# Patient Record
Sex: Female | Born: 1965 | Race: Black or African American | Hispanic: No | Marital: Married | Smoking: Never smoker
Health system: Southern US, Community
[De-identification: ages and names within clinical notes are randomized; demographics above are authoritative.]

## PROBLEM LIST (undated history)

## (undated) DIAGNOSIS — M7989 Other specified soft tissue disorders: Secondary | ICD-10-CM

---

## 1997-09-03 ENCOUNTER — Emergency Department (HOSPITAL_COMMUNITY): Admission: EM | Admit: 1997-09-03 | Discharge: 1997-09-03 | Payer: Self-pay | Admitting: Internal Medicine

## 1997-09-11 ENCOUNTER — Other Ambulatory Visit: Admission: RE | Admit: 1997-09-11 | Discharge: 1997-09-11 | Payer: Self-pay | Admitting: *Deleted

## 1998-06-12 ENCOUNTER — Inpatient Hospital Stay (HOSPITAL_COMMUNITY): Admission: AD | Admit: 1998-06-12 | Discharge: 1998-06-12 | Payer: Self-pay | Admitting: Obstetrics & Gynecology

## 1998-06-18 ENCOUNTER — Inpatient Hospital Stay (HOSPITAL_COMMUNITY): Admission: AD | Admit: 1998-06-18 | Discharge: 1998-06-18 | Payer: Self-pay | Admitting: Obstetrics and Gynecology

## 1998-09-19 ENCOUNTER — Other Ambulatory Visit: Admission: RE | Admit: 1998-09-19 | Discharge: 1998-09-19 | Payer: Self-pay | Admitting: Orthopedic Surgery

## 1999-06-11 ENCOUNTER — Other Ambulatory Visit: Admission: RE | Admit: 1999-06-11 | Discharge: 1999-06-11 | Payer: Self-pay | Admitting: Obstetrics and Gynecology

## 1999-10-27 ENCOUNTER — Encounter: Admission: RE | Admit: 1999-10-27 | Discharge: 2000-01-25 | Payer: Self-pay | Admitting: Obstetrics and Gynecology

## 1999-12-15 ENCOUNTER — Inpatient Hospital Stay (HOSPITAL_COMMUNITY): Admission: AD | Admit: 1999-12-15 | Discharge: 1999-12-15 | Payer: Self-pay | Admitting: Obstetrics and Gynecology

## 1999-12-29 ENCOUNTER — Inpatient Hospital Stay (HOSPITAL_COMMUNITY): Admission: AD | Admit: 1999-12-29 | Discharge: 1999-12-29 | Payer: Self-pay | Admitting: Obstetrics and Gynecology

## 2000-01-14 ENCOUNTER — Inpatient Hospital Stay (HOSPITAL_COMMUNITY): Admission: AD | Admit: 2000-01-14 | Discharge: 2000-01-17 | Payer: Self-pay | Admitting: Obstetrics and Gynecology

## 2000-01-14 ENCOUNTER — Encounter (INDEPENDENT_AMBULATORY_CARE_PROVIDER_SITE_OTHER): Payer: Self-pay

## 2000-01-18 ENCOUNTER — Encounter: Admission: RE | Admit: 2000-01-18 | Discharge: 2000-02-25 | Payer: Self-pay | Admitting: Obstetrics and Gynecology

## 2000-02-23 ENCOUNTER — Other Ambulatory Visit: Admission: RE | Admit: 2000-02-23 | Discharge: 2000-02-23 | Payer: Self-pay | Admitting: Obstetrics and Gynecology

## 2000-05-27 ENCOUNTER — Other Ambulatory Visit: Admission: RE | Admit: 2000-05-27 | Discharge: 2000-05-27 | Payer: Self-pay | Admitting: Obstetrics and Gynecology

## 2000-09-18 ENCOUNTER — Emergency Department (HOSPITAL_COMMUNITY): Admission: EM | Admit: 2000-09-18 | Discharge: 2000-09-19 | Payer: Self-pay | Admitting: Emergency Medicine

## 2001-02-05 ENCOUNTER — Emergency Department (HOSPITAL_COMMUNITY): Admission: EM | Admit: 2001-02-05 | Discharge: 2001-02-05 | Payer: Self-pay | Admitting: Emergency Medicine

## 2001-04-13 ENCOUNTER — Other Ambulatory Visit: Admission: RE | Admit: 2001-04-13 | Discharge: 2001-04-13 | Payer: Self-pay | Admitting: Obstetrics and Gynecology

## 2001-09-26 ENCOUNTER — Other Ambulatory Visit: Admission: RE | Admit: 2001-09-26 | Discharge: 2001-09-26 | Payer: Self-pay | Admitting: Obstetrics and Gynecology

## 2001-10-20 ENCOUNTER — Inpatient Hospital Stay (HOSPITAL_COMMUNITY): Admission: AD | Admit: 2001-10-20 | Discharge: 2001-10-20 | Payer: Self-pay | Admitting: *Deleted

## 2001-10-20 ENCOUNTER — Encounter: Payer: Self-pay | Admitting: *Deleted

## 2001-11-05 ENCOUNTER — Ambulatory Visit (HOSPITAL_COMMUNITY): Admission: RE | Admit: 2001-11-05 | Discharge: 2001-11-05 | Payer: Self-pay | Admitting: Obstetrics and Gynecology

## 2001-11-05 ENCOUNTER — Encounter (INDEPENDENT_AMBULATORY_CARE_PROVIDER_SITE_OTHER): Payer: Self-pay

## 2001-12-19 ENCOUNTER — Encounter: Admission: RE | Admit: 2001-12-19 | Discharge: 2001-12-19 | Payer: Self-pay | Admitting: Obstetrics and Gynecology

## 2001-12-19 ENCOUNTER — Encounter: Payer: Self-pay | Admitting: Obstetrics and Gynecology

## 2002-02-06 ENCOUNTER — Other Ambulatory Visit: Admission: RE | Admit: 2002-02-06 | Discharge: 2002-02-06 | Payer: Self-pay | Admitting: Obstetrics and Gynecology

## 2002-04-03 ENCOUNTER — Ambulatory Visit (HOSPITAL_COMMUNITY): Admission: RE | Admit: 2002-04-03 | Discharge: 2002-04-03 | Payer: Self-pay | Admitting: Gastroenterology

## 2002-07-13 ENCOUNTER — Encounter: Payer: Self-pay | Admitting: Obstetrics and Gynecology

## 2002-07-13 ENCOUNTER — Encounter: Admission: RE | Admit: 2002-07-13 | Discharge: 2002-07-13 | Payer: Self-pay | Admitting: Obstetrics and Gynecology

## 2002-09-06 ENCOUNTER — Other Ambulatory Visit: Admission: RE | Admit: 2002-09-06 | Discharge: 2002-09-06 | Payer: Self-pay | Admitting: Obstetrics and Gynecology

## 2002-12-22 ENCOUNTER — Other Ambulatory Visit: Admission: RE | Admit: 2002-12-22 | Discharge: 2002-12-22 | Payer: Self-pay | Admitting: Obstetrics and Gynecology

## 2002-12-22 ENCOUNTER — Encounter: Admission: RE | Admit: 2002-12-22 | Discharge: 2002-12-22 | Payer: Self-pay | Admitting: Obstetrics and Gynecology

## 2002-12-22 ENCOUNTER — Encounter: Payer: Self-pay | Admitting: Obstetrics and Gynecology

## 2003-08-08 ENCOUNTER — Other Ambulatory Visit: Admission: RE | Admit: 2003-08-08 | Discharge: 2003-08-08 | Payer: Self-pay | Admitting: Obstetrics and Gynecology

## 2003-12-24 ENCOUNTER — Encounter: Admission: RE | Admit: 2003-12-24 | Discharge: 2003-12-24 | Payer: Self-pay | Admitting: Obstetrics and Gynecology

## 2004-04-19 ENCOUNTER — Ambulatory Visit (HOSPITAL_COMMUNITY): Admission: RE | Admit: 2004-04-19 | Discharge: 2004-04-19 | Payer: Self-pay | Admitting: Obstetrics and Gynecology

## 2004-04-19 ENCOUNTER — Encounter (INDEPENDENT_AMBULATORY_CARE_PROVIDER_SITE_OTHER): Payer: Self-pay | Admitting: *Deleted

## 2004-12-25 ENCOUNTER — Encounter: Admission: RE | Admit: 2004-12-25 | Discharge: 2004-12-25 | Payer: Self-pay | Admitting: Obstetrics and Gynecology

## 2005-12-28 ENCOUNTER — Encounter: Admission: RE | Admit: 2005-12-28 | Discharge: 2005-12-28 | Payer: Self-pay | Admitting: Obstetrics and Gynecology

## 2006-05-12 ENCOUNTER — Inpatient Hospital Stay (HOSPITAL_COMMUNITY): Admission: AD | Admit: 2006-05-12 | Discharge: 2006-05-12 | Payer: Self-pay | Admitting: *Deleted

## 2006-05-17 ENCOUNTER — Observation Stay (HOSPITAL_COMMUNITY): Admission: AD | Admit: 2006-05-17 | Discharge: 2006-05-18 | Payer: Self-pay | Admitting: Obstetrics and Gynecology

## 2006-05-20 ENCOUNTER — Ambulatory Visit (HOSPITAL_COMMUNITY): Admission: RE | Admit: 2006-05-20 | Discharge: 2006-05-20 | Payer: Self-pay | Admitting: Obstetrics and Gynecology

## 2006-11-17 ENCOUNTER — Inpatient Hospital Stay (HOSPITAL_COMMUNITY): Admission: AD | Admit: 2006-11-17 | Discharge: 2006-11-17 | Payer: Self-pay | Admitting: Obstetrics and Gynecology

## 2006-12-14 ENCOUNTER — Encounter (INDEPENDENT_AMBULATORY_CARE_PROVIDER_SITE_OTHER): Payer: Self-pay | Admitting: Obstetrics and Gynecology

## 2006-12-14 ENCOUNTER — Inpatient Hospital Stay (HOSPITAL_COMMUNITY): Admission: AD | Admit: 2006-12-14 | Discharge: 2006-12-17 | Payer: Self-pay | Admitting: Obstetrics and Gynecology

## 2007-09-01 ENCOUNTER — Encounter: Admission: RE | Admit: 2007-09-01 | Discharge: 2007-09-01 | Payer: Self-pay | Admitting: Internal Medicine

## 2008-12-28 ENCOUNTER — Encounter: Admission: RE | Admit: 2008-12-28 | Discharge: 2008-12-28 | Payer: Self-pay | Admitting: Obstetrics and Gynecology

## 2010-01-01 ENCOUNTER — Encounter: Admission: RE | Admit: 2010-01-01 | Discharge: 2010-01-01 | Payer: Self-pay | Admitting: Obstetrics and Gynecology

## 2010-07-29 NOTE — Op Note (Signed)
NAMECACIE, [name]             ACCOUNT NO.:  [id number]   MEDICAL RECORD NO.:  [id number]          PATIENT TYPE:  INP   LOCATION:  9126                          FACILITY:  [facility]   PHYSICIAN:  Maxie Better, M.D.DATE OF BIRTH:  06-Apr-1965   DATE OF PROCEDURE:  12/14/2006  DATE OF DISCHARGE:                               OPERATIVE REPORT   PREOPERATIVE DIAGNOSIS:  Persistent oligohydramnios, class B diabetes,  intrauterine gestation at 38+ weeks, desires permanent sterilization.   PROCEDURE:  Repeat cesarean section, lysis of adhesions, modified  Pomeroy tubal ligation.   POSTOPERATIVE DIAGNOSIS:  Persistent oligohydramnios, class B diabetes,  intrauterine gestation at 38+ weeks, desires sterilization, pelvic  adhesions.   ANESTHESIA:  Spinal.   SURGEON:  Maxie Better, M.D.   ASSISTANT:  Marlinda Mike   INDICATIONS:  45 year old gravida 6, para 1-0-4-1 female with class B  gestational diabetes now at 38-1/7 weeks with a previous cesarean  section who was found to have oligohydramnios on September 25 and a  repeat sonogram today continues to show oligohydramnios.  The patient  now being admitted for repeat cesarean section.  She was previously  scheduled for repeat cesarean section on October 6.  The patient also  desires permanent sterilization.  Her diabetes had been managed by the  [facility].  Surgical risks had been reviewed with the  patient.  Consent was signed, the patient was transferred to the  operating room.   PROCEDURE:  Under adequate spinal anesthesia the patient is placed in  the supine position with a left lateral tilt.  She was sterilely prepped  and draped in the usual fashion.  Bladder had an indwelling Foley  catheter sterilely placed.  0.25% Marcaine was injected along the  previous Pfannenstiel skin incision.  Pfannenstiel skin incision was  then made through the previous scar, carried down to the rectus fascia.  Rectus fascia was  opened transversely.  The rectus fascia was then  sharply and bluntly dissected off the rectus muscle in inferior fashion  on doing the same procedure superiorly, the parietal peritoneum was  incidentally entered and utilizing that opening in the parietal  peritoneum allowed for further delineation of the rectus fascia.  It was  then noted that the parietal peritoneum was then further opened that  there was a right anterior lateral aspect of the uterus was densely  adherent to the right anterior abdominal wall for least 2-1/2 to 3  inches.  Omental adhesion had also encountered on opening in the  anterior wall which was subsequently lysed and the omental adhesions  removed.  The lower uterine segment was finally identified.  The bladder  was adherent to the lower uterine segment and careful dissection of the  vesicouterine peritoneum was then performed and the bladder was  partially sharply dissected off the lower uterine segment in order to  facilitate the surgery.  A lower uterine incision was then made and  extended with bandage scissors.  Artificial rupture of membranes with  scant fluid was done.  The baby was in the deflexed position.  Initially  attempt at spontaneous delivery in the  usual fashion was unsuccessful.  Vacuum application x3 was subsequently attempted.  The right rectus  muscle had to be partially cut in order to facilitate the delivery.  Subsequently a live female was delivered.  Cord around the neck times  wall was reducible.  Baby was delivered, bulb suctioned on the abdomen.  The cord was clamped, cut, the baby was transferred to the waiting  pediatrician who assigned Apgars of 9 and 9 at one and five minutes.  The placenta which was anterior was manually removed.  Uterine cavity  was then cleaned of debris.  Uterine incision had no extension.  Further  dissection of the bladder off the lower uterine segment was done with  sharp dissection in order to facilitate  closure of the uterine incision.  Incision was then closed with 0 Monocryl running locked stitch.  Attention was then turned to the right anterior aspect of the uterus  which showed a dense adhesion and in order to perform the sterilization,  access to the right side was necessary and therefore the adhesion was  carefully dissected off the anterior abdominal wall using cautery.  Once  that was dissected, the defect in the uterus was then closed with  interrupted 0 Monocryl sutures and bleeders cauterized.  Both tubes were  then identified.  Both ovaries were noted to be normal.  The midportion  of both fallopian tube was grasped with a Babcock.  The underlying  mesosalpinx was opened with cautery and the proximal distal portion of  both fallopian tubes were then tied with 0 chromic suture x2 and the  intervening segment of tubes were both removed.  Attention was then  turned back to the uterine incision which showed some bleeding.  Interrupted figure-of-eight sutures were the placed in order to  facilitate the hemostasis.  Re-enforcement of the defect in the uterus  from the adhesion was also requiring figure-of-eight sutures.  When good  hemostasis was noted, small surface bleeders were noted which were  initially cauterized.  The area was irrigated, suctioned and Interceed  placed overlying the raw surfaces.  The parietal peritoneum and the  vesicouterine peritoneum were not closed.  The rectus fascia was closed  with 0 Vicryl x2.  The subcutaneous areas irrigated, small bleeders  cauterized.  Interrupted 2-0 plain sutures were then placed.  Skin  approximated with Ethicon staples.  Specimen was portion of right and  left fallopian tubes sent to pathology.  Placenta not sent to pathology.  Estimated blood loss was 800 mL.  Intraoperative fluid was 200 mL of  crystalloid.  Urine output was 150 mL clear yellow urine.  Sponge,  instrument count x2 was correct.  Complication was none.   Weight of the  baby was 7 pounds 12 ounces.  The patient tolerated the procedure well,  was transferred to recovery in stable condition.      Maxie Better, M.D.  Electronically Signed     [place]/MEDQ  D:  12/14/2006  T:  12/15/2006  Job:  [id number]

## 2010-08-01 NOTE — H&P (Signed)
[facility]  PatientYVONNIE, [name]                      MRN: [id number] Adm. Date:  01/14/00 Attending:  Nena Jordan A. Cherly Hensen, M.D.                         History and Physical  CHIEF COMPLAINT:              Macrosomia, class A-1 gestational diabetic, primary cesarean section.  HISTORY OF PRESENT ILLNESS:   This is a 45 year old gravida 3, para 0-0-2-0 black female with a last menstrual period of April 05, 1999, [facility] of January 10, 2000, consistent with ultrasound done on April 10th at 11.[redacted] weeks gestation, who is currently 40-4/[redacted] weeks gestation, being admitted for a primary cesarean section secondary to fetal macrosomia and class A-1 gestational diabetes.  The patient underwent an ultrasound at [facility] on January 13, 2000, at which time the estimated fetal weight was 4211 g, which was at the 86th percentile; amniotic fluid index of 63%.  The patient has been measuring size greater than dates.  Her exam revealed a cervix that was closed, long, presenting part not in the pelvis.  The patient had been scheduled for induction of labor until the finding of ultrasound was noted. Given the increased risk for shoulder dystocia with a vaginal delivery, the patient was informed of the recommendation for a primary cesarean section. Her prenatal course has been notable for the class A-1 gestational diabetes which has been relatively controlled.  She is GBS culture positive.  The patient has been followed with antepartum surveillance using nonstress tests and biophysical profiles.  The prenatal care has been with [facility].  PRENATAL LABORATORY DATA:     Blood type is A-positive.  Antibody screen is negative.  Sickle cell is negative.  RPR is nonreactive.  Rubella is immune. Hepatitis B surface antigen is negative.  HIV is nonreactive.  Pap was normal. GC and Chlamydia cultures were negative.  Amniocentesis had been performed for advanced  maternal age, with a normal amniotic fluid AFP and normal chromosomes of 103 XX.  The patient had one-hour GCT done on August 9th, at which time her fasting blood sugar was 133.  A one-hour GCT was 208.  The patient was started with nutritional and diabetic counseling and has been seen weekly for evaluation and management of her blood sugars.  Group B strep culture was positive on December 08, 1999.  ALLERGIES:                    No known drug allergies.  MEDICINES:                    Prenatal vitamins.  PAST MEDICAL HISTORY:         Negative.  PAST SURGICAL HISTORY:        D&C.  OBSTETRICAL HISTORY:          Elective termination, first trimester, no complications, 1996.  March of 2000, right ectopic pregnancy, treated with methotrexate x 2.  FAMILY HISTORY:               Mother with hypertension.  Paternal grandmother with complications of diabetes.  Paternal grandfather with diabetes as well. Maternal great-aunt with female cancer, type unknown.  Maternal great-aunt with colon cancer.  SOCIAL HISTORY:  Recently married.  Works at [employer].  REVIEW OF SYSTEMS:            Negative except for those noted in the history of present illness.  PHYSICAL EXAMINATION  GENERAL:                      Gravid female in no acute distress.  VITAL SIGNS:                  Blood pressure 116/72.  Weight is 236 pounds. Fetal heart rate of 146.  SKIN:                         No lesions.  HEENT:                        Anicteric sclerae.  Pink conjunctivae. Oropharynx negative.  HEART:                        Regular rate and rhythm without murmur.  BREASTS:                      Soft, nontender.  No palpable mass.  LUNGS:                        Clear to auscultation.  ABDOMEN:                      Gravid.  Fundal height of 42 cm.  PELVIC:                       Vulva shows no lesions.  Bimanual examination revealed a cervix that was soft, closed, long, with the presenting  part vertex, not in pelvis.  EXTREMITIES:                  Edema 1+.  IMPRESSION:                   1. Class A-1 gestational diabetes.                               2. Fetal macrosomia.                               3. Intrauterine gestation of 40-4/7 weeks.  PLAN:                         Admission.  Routine labs.  Primary cesarean section.  Postop analgesics.  Patient was informed the cesarean section is to reduce the risk of shoulder dystocia.  The risks of the procedure include infection, bleeding, bleeding which may require blood transfusion, possible need for hysterectomy for uncontrollable bleeding, injury to surrounding organ structures, internal scar tissue or possible need for cesarean section in the future.  All questions were answered.  The patient was given ACOG literature on cesarean section. DD:  01/14/00 TD:  01/14/00 Job: [id number]

## 2010-08-01 NOTE — Op Note (Signed)
[facility]  Patient:    [name], [name]                    MRN: [id number] Proc. Date: 01/14/00 Adm. Date:  [id number] Attending:  Maxie Better                           Operative Report  PREOPERATIVE DIAGNOSES:       Fetal macrosomia, class A-1 gestational diabetes, intrauterine gestation at 40-4/7 weeks.  POSTOPERATIVE DIAGNOSES:      Fetal macrosomia, class A-1 gestational diabetes, intrauterine gestation at 40-4/7 weeks, left paratubal cyst.  PROCEDURE:                    Primary cesarean section, left paratubal cyst removal.  ANESTHESIA:                   Spinal.  SURGEON:                      Sheronette A. Cherly Hensen, M.D.  ASSISTANT:                    Cordelia Pen A. Rosalio Macadamia, M.D.  INDICATION:                   This is a 45 year old gravida 3, para 0-0-2-0 female who is now at 40-4/[redacted] weeks gestation with known class A-1 gestational diabetes and fetus with an estimated fetal weight of 4211 g, who is now being admitted for a primary cesarean section to reduce her risk of shoulder dystocia.  Risks and benefits of the procedure had been explained to the patient, consent was signed and the patient was transferred to the operating room.  DESCRIPTION OF PROCEDURE:     Under adequate spinal anesthesia, the patient was placed in a supine position with a left lateral tilt.  The patient was sterilely prepped and draped in usual fashion.  A indwelling Foley catheter was sterilely placed.  A Pfannenstiel skin incision was made and carried down to the rectus fascia using a scalpel.  The rectus fascia was incised in the midline and extended bilaterally.  The rectus fascia was then bluntly and with sharp dissection dissected off the rectus muscle in a superior and inferior fascia.  The rectus muscle was split in the midline sharply.  The parietal peritoneum was entered bluntly and extended superiorly and inferiorly.  The vesicouterine peritoneum  was then opened and extended bilaterally.  The bladder was then bluntly dissected off the lower uterine segment and displaced from the operative field using a bladder retractor.  A curvilinear low transverse uterine incision was then made and extended bilaterally using bandage scissors.  Bulging amniotic membrane was noted; this was ruptured. Clear amniotic fluid was seen.  The vertex was initially attempted to be delivered in the usual fashion; this was not felt to be possible without an assistance of the vacuum, therefore, a vacuum was applied and with gentle traction, the vertex was delivered from a left occipitotransverse position. Baby was bulb-suctioned on the abdomen.  The remaining part was delivered after the cord around the neck x 1 was reduced.  The cords were the clamped and cut.  The baby was transferred to the awaiting pediatricians who assigned Apgars of 8 and 9 at one and five minutes.  The placenta, which was anterior, was spontaneous, intact, removed.  Uterine cavity was cleaned of  debris.  A palpable 2.5-cm intramural fibroid was noted anteriorly.  Normal ovaries were noted bilaterally.  The fallopian tubes appeared normal.  The left had a paratubal cyst on its distal end which was removed with cautery.  The right had a paratubal cyst that was not amenable for removal and was left alone. The uterine incision was closed; the first layer with a 0 Monocryl running-locked stitch.  The second layer was imbricated using 0 Monocryl suture.  Area of bleeding was noted on the left of the midline and this was hemostased with a figure-of-eight 0 Monocryl suture.  There were then noted some varicosities in the lower uterine segment between the bladder and the uterus that were bleeding excessively and the figure-of-eight suture was then placed that contained these bleeders.  A second figure-of-eight was then placed below that.  There was noted to be continued bleeding from that  site. Countertraction was made with the uterus and the bladder and using the Metzenbaum scissors, the varicosities were separated from the bladder and appeared to be confined to the uterus.  A third single suture was then placed, with good hemostasis subsequently noted.  Small bleeders along the peritoneal edges were cauterized.  The abdomen was irrigated and suctioned of debris. Paracolic gutters were cleaned of debris.  Reinspection of the incision site showed again good hemostasis.  Inspection of the undersurface of the bladder also showed no further bleeding, at which time decision was then made to close the remainder of the abdomen.  The vesicouterine peritoneum and the parietal peritoneum were not closed.  The undersurface of rectus fascia was inspected; the rectus muscle was also inspected and small bleeders cauterized.  The rectus fascia was closed with 0 Vicryl x 2.  The subcutaneous area was irrigated and small bleeders cauterized.  The subcuticular area was injected with a total of 19.5 cc of 0.25% Marcaine.  The skin was approximated using Ethicon staples.  Specimens were placenta, not sent to pathology, and the left paratubal cyst, which was sent.  Estimated blood loss was 500 cc. Intraoperative fluid was 2700 cc of crystalloid.  Urine output was 30 cc clear-yellow urine; the patient had voided prior to entering the operating room.  Sponge and instrument counts x 2 were correct.  Complication was none. The patient tolerated the procedure well and was transferred to the recovery room in stable condition. DD:  01/14/00 TD:  01/15/00 Job: [id number]

## 2010-08-01 NOTE — Op Note (Signed)
   [name], [name]                         ACCOUNT NO.:  [id number]   MEDICAL RECORD NO.:  [id number]                   PATIENT TYPE:   LOCATION:                                       FACILITY:   PHYSICIAN:  Danise Edge, M.D.                DATE OF BIRTH:   DATE OF PROCEDURE:  04/03/2002  DATE OF DISCHARGE:                                 OPERATIVE REPORT   PROCEDURE:  Screening colonoscopy.   INDICATIONS:  The patient is a 45 year old female.  Her father has had colon  polyps removed in his 82s.  Her 15 year old sister underwent a diagnostic  colonoscopy to evaluate hematochezia, and a polyp was removed.  Her sister  lives in [place], [place].  I do not have a pathology report of  the  polyp removed from her sister's colon.   ENDOSCOPIST:  Danise Edge, M.D.   PREMEDICATION:  Versed 10 mg, Demerol 50 mg.   ENDOSCOPE:  Olympus pediatric colonoscope.   DESCRIPTION OF PROCEDURE:  After obtaining informed consent, the patient was  placed in the left lateral decubitus position.  I administered intravenous  Demerol and intravenous Versed to achieve conscious sedation for the  procedure.  The patient's blood pressure, oxygen saturation, and cardiac  rhythm were monitored throughout the procedure and documented in the medical  record.   Anal inspection was normal.  Digital rectal exam was normal.  The Olympus  pediatric video colonoscope was introduced into the rectum and advanced to  the cecum.  Colonic preparation for the exam today was excellent.   Rectum normal.   Sigmoid colon and descending colon normal.   Splenic flexure normal.   Transverse colon normal.   Hepatic flexure normal.   Ascending colon normal.   Cecum and ileocecal valve normal.    ASSESSMENT:  Normal proctocolonoscopy to the cecum.  No endoscopic evidence  for the presence of colorectal neoplasia.                                                Danise Edge, M.D.    MJ/MEDQ   D:  04/03/2002  T:  04/03/2002  Job:  [id number]   cc:   Thora Lance, M.D.  [address]  [place]  [place] [address]  Fax: [phone]

## 2010-08-01 NOTE — Discharge Summary (Signed)
[facility]  Patient:    [name], [name]                    MRN: [id number] Adm. Date:  [id number] Disc. Date: [id number] Attending:  Maxie Better                           Discharge Summary  ADMISSION DIAGNOSES:          1. Fetal macrosomia.                               2. Class A1 gestational diabetes.                               3. Term gestation.  DISCHARGE DIAGNOSES:          1. Term gestation, delivered.                               2. Class A1 gestational diabetes.                               3. Fetal macrosomia.                               4. Left paratubal cyst.                               5. Intramural fibroid.                               6. Postoperative anemia.  OPERATION/PROCEDURE:          1. Primary cesarean section with a low                                  transverse uterine incision.                               2. Paratubal cyst removal.  HISTORY OF PRESENT ILLNESS:   This patient is a 45 year old gravida 3 para 0 female, at 40-4/7th weeks gestation, with class A1 diabetes, admitted for probable infection secondary to fetal macrosomia and her gestational diabetes.  Ultrasound done on January 13, 2000 revealed an estimated fetal weight of 4211 g, which was at the 86th percentile, with normal amniotic fluid index.  PRENATAL COURSE:              The patients prenatal course was otherwise unremarkable.  The patient had antenatal testing for her diabetes.  Her blood type is A-positive.  Rubella is immune.  Hepatitis B surface antigen is negative.  Amniocentesis showed normal chromosomes and alpha fetoprotein. Group B strep culture was positive.  HOSPITAL COURSE:              The patient was admitted and routine laboratories were obtained.  She was transferred to the operating room and underwent a primary cesarean section, with resultant delivery of a viable female infant  from the left occipitotransverse  presentation, with cord around the neck x 1 which was reducible.  The infant weighed nine pounds and five ounces and was assigned Apgar scores of 8 at one minute and 9 at five minutes. The patient had a left paratubal cyst that was removed and a 2.5 cm anterior intramural fibroid was noted.  The tubes and ovaries were normal.  The patients postoperative course was unremarkable.  She was tolerating a regular diet and passing flatus by postoperative day #3.  She remained afebrile throughout her hospital course.  Her incision showed no erythema, induration, or exudate.  Staples were removed on postoperative day #3 and Steri-Strips placed.  On postoperative day #1 a CBC showed a hemoglobin of 9.6, hematocrit 27, WBC 11.2.  Preoperative hemoglobin was 12.7.  DISPOSITION: Home.  DISCHARGE CONDITION: Stable.  FOLLOW-UP: The patient is to follow up in four weeks postpartum.  DISCHARGE INSTRUCTIONS: Discharge teaching was given.  The patient was instructed to call for temperature greater than or equal to 100.4 degrees. Nothing per vagina and four to six weeks.  No heavy lifting or driving for two weeks.  Call for increased incisional pain, redness, or drainage.  Call for severe abdominal pain or nausea or vomiting.  DISCHARGE MEDICATIONS:  1. Over-the-counter iron supplementation.  2. Prenatal vitamins 1 p.o. q.d.  3. Tylox 1-2 q.3h to q.4h p.r.n. pain.  5. Ibuprofen 800 mg q.6h p.r.n. pain. DD:  02/08/00 TD:  02/08/00 Job: [id number]

## 2010-08-01 NOTE — Op Note (Signed)
[name], [name]             ACCOUNT NO.:  [id number]   MEDICAL RECORD NO.:  [id number]          PATIENT TYPE:  AMB   LOCATION:  SDC                           FACILITY:  [facility]   PHYSICIAN:  Maxie Better, M.D.DATE OF BIRTH:  Oct 13, 1965   DATE OF PROCEDURE:  04/19/2004  DATE OF DISCHARGE:                                 OPERATIVE REPORT   PREOPERATIVE DIAGNOSIS:  Missed abortion, recurrent pregnancy loss.   POSTOPERATIVE DIAGNOSIS:  Missed abortion, recurrent pregnancy loss.   OPERATION:  Ultrasound-guided suction dilation and evacuation.   SURGEON:  Maxie Better, M.D.   ANESTHESIA:  MAC, paracervical block.   INDICATIONS:  A 45 year old gravida 5, para 1-0-4-1 female with a diagnosis  of missed abortion by ultrasound at 9 weeks, who now presents for surgical  management.  The patient also desires further evaluation of the etiology of  her pregnancy losses.  The risks and benefits of the procedure have been  explained to the patient, consent was signed, and the patient was  transferred to the operating room.   DESCRIPTION OF PROCEDURE:  Under adequate monitored anesthesia, the patient  was placed in the dorsal lithotomy position, examination under anesthesia  revealed an anteverted, 9-week size uterus, no adnexal masses could be  appreciated.  The patient was sterilely prepped and draped in the usual  fashion.  The bladder was catheterized of  a small amount of urine.  A  bivalved speculum was placed in the vagina. Then 20 mL of 1% nesicaine was  injected paracervically.  The anterior lip of the cervix was grasped with a  single-tooth tenaculum.  Attempts at cervical dilatation was not felt to be  successful, and consent for uterine perforation resulted in ultrasound  requested to assist with the procedure.  Once the sonographer arrived, and  ultrasound guidance transabdominally was done, the dilators were then  utilized, which showed indeed that the internal os  was much more acute, than  where I was heading.  The cervix was then serially dilated up to #25 [facility]  dilator under ultrasound guidance.  A 7 mm curved suction cannula was  introduced into the uterine cavity.  The cavity was then subsequently  curetted and suctioned again.  A thin endometrial stripe by ultrasound was  then noted, at which time all instruments were then removed from the vagina.  Specimen labeled products of conception, a piece of which was sent for  karyotype or sent to pathology.   ESTIMATED BLOOD LOSS:  Minimal.   COMPLICATIONS:  None.   The patient tolerated the procedure well, was transferred to the recovery  room in stable condition.      [place]/MEDQ  D:  04/19/2004  T:  04/19/2004  Job:  [id number]

## 2010-08-01 NOTE — Discharge Summary (Signed)
Kimberly, Winters             ACCOUNT NO.:  [id number]   MEDICAL RECORD NO.:  [id number]          PATIENT TYPE:  INP   LOCATION:  9126                          FACILITY:  [facility]   PHYSICIAN:  Maxie Better, M.D.DATE OF BIRTH:  04-Jan-1966   DATE OF ADMISSION:  12/14/2006  DATE OF DISCHARGE:  12/17/2006                               DISCHARGE SUMMARY   ADMISSION DIAGNOSES:  1. Persistent oligohydramnios.  2. Class B diabetes.  3. Desires permanent sterilization.  4. Intrauterine gestation at 38-1/7 weeks.   DISCHARGE DIAGNOSES:  1. Persistent oligohydramnios.  2. Class B diabetes.  3. Desires permanent sterilization.  4. Intrauterine gestation at 38-1/7 weeks, delivered.   PROCEDURE:  Repeat cesarean section,  Modified P[place]oy tubal ligation,  and lysis of adhesions.   HOSPITAL COURSE:  The patient was admitted to [facility].  She was  taken to the operating room where she underwent a repeat cesarean  section, tubal ligation, and lysis of adhesions.  Please see the  dictated operative note for the specific details.  The procedure  resulted in delivery of a live female who had a cord around the neck x1.  Apgars were 9 and 9.  Right uterine wall was adherent to the right  anterior wall.  Abdominal ovaries and tubes were otherwise identified.  The patient was continued on her insulin regimen and managed  accordingly.  She otherwise had an uncomplicated postoperative course  with CBC on postoperative day #1 showed a hemoglobin of 11.2, hematocrit  32.5, white count 11.8, platelet count 281,000.  Her pathology was  consistent with complete transection of her fallopian tube segments.  By  postoperative day #3 she was doing very well and the incision had no  erythema, induration, or exudates.  She was ready for discharge.   DISPOSITION:  Home.   CONDITION ON DISCHARGE:  Stable.   DISCHARGE MEDICATIONS:  1. Motrin 800 mg every 8 hours p.r.n. pain.  2. Percocet one to  two tablets every 4-6 hours p.r.n. pain.  3. Prenatal vitamins one p.o. daily.  4. Humalog if blood sugar is 150-200 4 units, 200-250 5 units, greater      251 call the office.  5. Lantus 24 units at bedtime.   FOLLOWUP:  At [facility] for staple removal on Tuesday and for  postpartum appointment in six weeks.   DISCHARGE INSTRUCTIONS:  Per the postpartum booklet given.      Maxie Better, M.D.  Electronically Signed     [place]/MEDQ  D:  02/08/2007  T:  02/08/2007  Job:  [id number]

## 2010-08-01 NOTE — Op Note (Signed)
   NAMEKERRI, [name]                       ACCOUNT NO.:  [id number]   MEDICAL RECORD NO.:  [id number]                   PATIENT TYPE:  AMB   LOCATION:  SDC                                  [facility]ACILITY:  [facility]   PHYSICIAN:  Sheronette A. Cherly Hensen, M.D.         DATE OF BIRTH:  01-17-1966   DATE OF PROCEDURE:  11/05/2001  DATE OF DISCHARGE:  11/05/2001                                 OPERATIVE REPORT   PREOPERATIVE DIAGNOSIS:  Blighted ovum.   POSTOPERATIVE DIAGNOSIS:  Blighted ovum.   PROCEDURE:  Suction, dilation and evacuation.   SURGEON:  Sheronette A. Cherly Hensen, M.D.   ANESTHESIA:  MAC, paracervical block.   DESCRIPTION OF PROCEDURE:  Under adequate monitored anesthesia, the patient  was placed in the dorsal lithotomy position.  Examination under anesthesia  revealed anteverted uterus about 7-8 weeks size.  No adnexal masses could be  appreciated.  The patient was sterilely prepped and draped in the usual  fashion.  The bladder was catheterized of a small amount of urine.  Bivalve  speculum was placed in the vagina and 20 cc of 1% Nesacaine was injected at  3 and 9 o'clock paracervically.  The anterior leaf of the cervix was then  grasped with a single-tooth tenaculum.  The cervix was then serially dilated  up to #27 [facility] dilator.  A 7 mm suction cannula was introduced into the  uterine cavity without incident.  The cavity was suctioned of a moderate  amount of fluid and tissue.  The cavity was curetted and resuctioned, and  once again curetted until the uterine wall was found to be clean at which  time all instruments were then removed from the vagina.  Specimen labeled  products of conception was then delivered to pathology.  Estimated blood  loss was minimal.  Complications none.  The patient tolerated the procedure  well and was transferred to the recovery room in stable condition.                                               Sheronette A. Cherly Hensen, M.D.    [place]/MEDQ   D:  11/05/2001  T:  11/07/2001  Job:  [id number]

## 2010-08-22 ENCOUNTER — Other Ambulatory Visit: Payer: Self-pay | Admitting: Obstetrics and Gynecology

## 2010-10-02 ENCOUNTER — Encounter: Payer: Self-pay | Admitting: Student

## 2010-10-02 ENCOUNTER — Other Ambulatory Visit: Payer: Self-pay

## 2010-10-02 ENCOUNTER — Emergency Department (HOSPITAL_BASED_OUTPATIENT_CLINIC_OR_DEPARTMENT_OTHER)
Admission: EM | Admit: 2010-10-02 | Discharge: 2010-10-02 | Disposition: A | Discharge status: Home / Self Care | Payer: No Typology Code available for payment source | Attending: Emergency Medicine | Admitting: Emergency Medicine

## 2010-10-02 DIAGNOSIS — R002 Palpitations: Secondary | ICD-10-CM | POA: Insufficient documentation

## 2010-10-02 DIAGNOSIS — E119 Type 2 diabetes mellitus without complications: Secondary | ICD-10-CM | POA: Insufficient documentation

## 2010-10-02 HISTORY — DX: Other specified soft tissue disorders: M79.89

## 2010-10-02 LAB — DIFFERENTIAL
Basophils Absolute: 0 10*3/uL (ref 0.0–0.1)
Basophils Relative: 0 % (ref 0–1)
Eosinophils Absolute: 0.1 10*3/uL (ref 0.0–0.7)
Eosinophils Relative: 1 % (ref 0–5)
Lymphocytes Relative: 33 % (ref 12–46)
Lymphs Abs: 3.1 10*3/uL (ref 0.7–4.0)
Monocytes Absolute: 1 10*3/uL (ref 0.1–1.0)
Monocytes Relative: 10 % (ref 3–12)
Neutro Abs: 5.3 10*3/uL (ref 1.7–7.7)
Neutrophils Relative %: 56 % (ref 43–77)

## 2010-10-02 LAB — COMPREHENSIVE METABOLIC PANEL
ALT: 20 U/L (ref 0–35)
AST: 18 U/L (ref 0–37)
Albumin: 4.1 g/dL (ref 3.5–5.2)
Alkaline Phosphatase: 45 U/L (ref 39–117)
BUN: 12 mg/dL (ref 6–23)
CO2: 26 mEq/L (ref 19–32)
Calcium: 9.8 mg/dL (ref 8.4–10.5)
Chloride: 99 mEq/L (ref 96–112)
Creatinine, Ser: 0.7 mg/dL (ref 0.50–1.10)
GFR calc Af Amer: >60 mL/min (ref >60)
GFR calc non Af Amer: >60 mL/min (ref >60)
Glucose, Bld: 95 mg/dL (ref 70–99)
Potassium: 3.4 mEq/L — ABNORMAL LOW (ref 3.5–5.1)
Sodium: 137 mEq/L (ref 135–145)
Total Bilirubin: 0.3 mg/dL (ref 0.3–1.2)
Total Protein: 8.4 g/dL — ABNORMAL HIGH (ref 6.0–8.3)

## 2010-10-02 LAB — CBC
HCT: 38 % (ref 36.0–46.0)
Hemoglobin: 12.8 g/dL (ref 12.0–15.0)
MCH: 28.3 pg (ref 26.0–34.0)
MCHC: 33.7 g/dL (ref 30.0–36.0)
MCV: 84.1 fL (ref 78.0–100.0)
Platelets: 313 10*3/uL (ref 150–400)
RBC: 4.52 MIL/uL (ref 3.87–5.11)
RDW: 13.3 % (ref 11.5–15.5)
WBC: 9.4 10*3/uL (ref 4.0–10.5)

## 2010-10-02 LAB — TSH: TSH: 2.159 u[IU]/mL (ref 0.350–4.500)

## 2010-10-02 NOTE — ED Notes (Signed)
Palpitations intermittently since yesterday. Reports sudden shortness of breath with onset but denies N VD CP LOC

## 2010-10-02 NOTE — ED Provider Notes (Addendum)
History     Chief Complaint  Patient presents with  . Palpitations   HPI Comments: States feels heart racing.  No precipitating factors.  Denies chest pain or shortness of breath.  Denies excessive caffeine use.  Patient is a 45 y.o. female presenting with palpitations. The history is provided by the patient.  Palpitations  This is a recurrent problem. The current episode started 1 to 2 hours ago. The problem occurs every several days. The problem has not changed since onset.The problem is associated with an unknown factor. Pertinent negatives include no chest pain, no cough and no shortness of breath.    Past Medical History  Diagnosis Date  . Diabetes mellitus   . Bilateral swelling of feet     Past Surgical History  Procedure Date  . Cesarean section     History reviewed. No pertinent family history.  History  Substance Use Topics  . Smoking status: Never Smoker   . Smokeless tobacco: Not on file  . Alcohol Use: No    OB History    Grav Para Term Preterm Abortions TAB SAB Ect Mult Living                  Review of Systems  Respiratory: Negative for cough and shortness of breath.   Cardiovascular: Positive for palpitations. Negative for chest pain and leg swelling.  All other systems reviewed and are negative.   Date: 12/13/2010  Rate: 81  Rhythm: normal sinus rhythm  QRS Axis: normal  Intervals: normal  ST/T Wave abnormalities: normal  Conduction Disutrbances:none  Narrative Interpretation:   Old EKG Reviewed: unchanged    Physical Exam  BP 154/82  Pulse 87  Temp(Src) 98.2 F (36.8 C) (Oral)  Resp 20  Wt 235 lb (106.595 kg)  SpO2 99%  LMP 10/02/2010  Physical Exam  Constitutional: She is oriented to person, place, and time. She appears well-developed and well-nourished. No distress.  HENT:  Head: Normocephalic and atraumatic.  Eyes: Pupils are equal, round, and reactive to light.  Neck: Normal range of motion. Neck supple. No thyromegaly  present.  Cardiovascular: Normal rate, regular rhythm and normal heart sounds.  Exam reveals no gallop and no friction rub.   No murmur heard. Pulmonary/Chest: Effort normal and breath sounds normal. No respiratory distress. She has no wheezes.  Abdominal: Soft. Bowel sounds are normal. She exhibits no distension. There is no tenderness.  Musculoskeletal: Normal range of motion.  Neurological: She is alert and oriented to person, place, and time.  Skin: Skin is warm and dry. She is not diaphoretic.  Psychiatric: She has a normal mood and affect.    ED Course  Procedures  MDM Ekg shows nsr at 81 bpm.  No st changes.      Geoffery Lyons 10/07/10 1478  Geoffery Lyons, MD 12/13/10 [phone]

## 2010-12-11 ENCOUNTER — Other Ambulatory Visit: Payer: Self-pay | Admitting: Internal Medicine

## 2010-12-11 DIAGNOSIS — Z1231 Encounter for screening mammogram for malignant neoplasm of breast: Secondary | ICD-10-CM

## 2010-12-25 LAB — CBC
HCT: 32.5 — ABNORMAL LOW
HCT: 37.8
Hemoglobin: 11.2 — ABNORMAL LOW
Hemoglobin: 12.9
MCHC: 34.5
MCHC: 34.3
MCV: 84.8
MCV: 84.4
Platelets: 281
Platelets: 341
RBC: 3.83 — ABNORMAL LOW
RBC: 4.48
RDW: 16.1 — ABNORMAL HIGH
RDW: 16.2 — ABNORMAL HIGH
WBC: 11.8 — ABNORMAL HIGH
WBC: 12.1 — ABNORMAL HIGH

## 2010-12-25 LAB — RPR: RPR Ser Ql: NONREACTIVE

## 2011-01-09 ENCOUNTER — Ambulatory Visit: Payer: No Typology Code available for payment source

## 2011-01-19 ENCOUNTER — Ambulatory Visit
Admission: RE | Admit: 2011-01-19 | Discharge: 2011-01-19 | Disposition: A | Discharge status: Home / Self Care | Payer: No Typology Code available for payment source | Source: Ambulatory Visit | Attending: Internal Medicine | Admitting: Internal Medicine

## 2011-01-19 DIAGNOSIS — Z1231 Encounter for screening mammogram for malignant neoplasm of breast: Secondary | ICD-10-CM

## 2011-05-24 ENCOUNTER — Emergency Department (HOSPITAL_COMMUNITY)
Admission: EM | Admit: 2011-05-24 | Discharge: 2011-05-24 | Disposition: A | Discharge status: Home / Self Care | Payer: No Typology Code available for payment source | Attending: Emergency Medicine | Admitting: Emergency Medicine

## 2011-05-24 ENCOUNTER — Encounter (HOSPITAL_COMMUNITY): Payer: Self-pay | Admitting: Emergency Medicine

## 2011-05-24 DIAGNOSIS — R000 Tachycardia, unspecified: Secondary | ICD-10-CM | POA: Insufficient documentation

## 2011-05-24 DIAGNOSIS — E119 Type 2 diabetes mellitus without complications: Secondary | ICD-10-CM | POA: Insufficient documentation

## 2011-05-24 DIAGNOSIS — R809 Proteinuria, unspecified: Secondary | ICD-10-CM | POA: Insufficient documentation

## 2011-05-24 DIAGNOSIS — R5381 Other malaise: Secondary | ICD-10-CM | POA: Insufficient documentation

## 2011-05-24 DIAGNOSIS — R824 Acetonuria: Secondary | ICD-10-CM | POA: Insufficient documentation

## 2011-05-24 DIAGNOSIS — R739 Hyperglycemia, unspecified: Secondary | ICD-10-CM

## 2011-05-24 LAB — CBC
HCT: 39.8 % (ref 36.0–46.0)
Hemoglobin: 13.7 g/dL (ref 12.0–15.0)
MCH: 29.1 pg (ref 26.0–34.0)
MCHC: 34.4 g/dL (ref 30.0–36.0)
MCV: 84.5 fL (ref 78.0–100.0)
Platelets: 312 10*3/uL (ref 150–400)
RBC: 4.71 MIL/uL (ref 3.87–5.11)
RDW: 13.4 % (ref 11.5–15.5)
WBC: 10.8 10*3/uL — ABNORMAL HIGH (ref 4.0–10.5)

## 2011-05-24 LAB — POCT I-STAT 3, VENOUS BLOOD GAS (G3P V)
Acid-base deficit: 1 mmol/L (ref 0.0–2.0)
Bicarbonate: 23.5 mEq/L (ref 20.0–24.0)
O2 Saturation: 86 %
Patient temperature: 98.5
TCO2: 25 mmol/L (ref 0–100)
pCO2, Ven: 36 mmHg — ABNORMAL LOW (ref 45.0–50.0)
pH, Ven: 7.423 — ABNORMAL HIGH (ref 7.250–7.300)
pO2, Ven: 50 mmHg — ABNORMAL HIGH (ref 30.0–45.0)

## 2011-05-24 LAB — URINALYSIS, ROUTINE W REFLEX MICROSCOPIC
Bilirubin Urine: NEGATIVE
Glucose, UA: >1000 mg/dL — AB
Ketones, ur: 15 mg/dL — AB
Leukocytes, UA: NEGATIVE
Nitrite: NEGATIVE
Protein, ur: 30 mg/dL — AB
Specific Gravity, Urine: 1.045 — ABNORMAL HIGH (ref 1.005–1.030)
Urobilinogen, UA: 0.2 mg/dL (ref 0.0–1.0)
pH: 5 (ref 5.0–8.0)

## 2011-05-24 LAB — GLUCOSE, CAPILLARY
Glucose-Capillary: 293 mg/dL — ABNORMAL HIGH (ref 70–99)
Glucose-Capillary: 194 mg/dL — ABNORMAL HIGH (ref 70–99)

## 2011-05-24 LAB — DIFFERENTIAL
Basophils Absolute: 0 10*3/uL (ref 0.0–0.1)
Basophils Relative: 0 % (ref 0–1)
Eosinophils Absolute: 0.1 10*3/uL (ref 0.0–0.7)
Eosinophils Relative: 1 % (ref 0–5)
Lymphocytes Relative: 31 % (ref 12–46)
Lymphs Abs: 3.3 10*3/uL (ref 0.7–4.0)
Monocytes Absolute: 0.8 10*3/uL (ref 0.1–1.0)
Monocytes Relative: 8 % (ref 3–12)
Neutro Abs: 6.5 10*3/uL (ref 1.7–7.7)
Neutrophils Relative %: 61 % (ref 43–77)

## 2011-05-24 LAB — COMPREHENSIVE METABOLIC PANEL
ALT: 22 U/L (ref 0–35)
AST: 16 U/L (ref 0–37)
Albumin: 4 g/dL (ref 3.5–5.2)
Alkaline Phosphatase: 54 U/L (ref 39–117)
BUN: 12 mg/dL (ref 6–23)
CO2: 28 mEq/L (ref 19–32)
Calcium: 10.7 mg/dL — ABNORMAL HIGH (ref 8.4–10.5)
Chloride: 98 mEq/L (ref 96–112)
Creatinine, Ser: 0.7 mg/dL (ref 0.50–1.10)
GFR calc Af Amer: >90 mL/min (ref >90)
GFR calc non Af Amer: >90 mL/min (ref >90)
Glucose, Bld: 259 mg/dL — ABNORMAL HIGH (ref 70–99)
Potassium: 3.6 mEq/L (ref 3.5–5.1)
Sodium: 136 mEq/L (ref 135–145)
Total Bilirubin: 0.3 mg/dL (ref 0.3–1.2)
Total Protein: 8.4 g/dL — ABNORMAL HIGH (ref 6.0–8.3)

## 2011-05-24 LAB — URINE MICROSCOPIC-ADD ON

## 2011-05-24 MED ORDER — SODIUM CHLORIDE 0.9 % IV BOLUS (SEPSIS)
1000.0000 mL | Freq: Once | INTRAVENOUS | Status: AC
  Administered 2011-05-24: 1000 mL via INTRAVENOUS

## 2011-05-24 MED ORDER — METFORMIN HCL 500 MG PO TABS: 500.0000 mg | ORAL_TABLET | Freq: Two times a day (BID) | ORAL | Status: AC

## 2011-05-24 NOTE — Discharge Instructions (Signed)
Hyperglycemia  Hyperglycemia occurs when the glucose (sugar) in your blood is too high. Hyperglycemia can happen for many reasons, but it most often happens to people who do not know they have diabetes or are not managing their diabetes properly.   CAUSES   Whether you have diabetes or not, there are other causes of hyperglycemia. Hyperglycemia can occur when you have diabetes, but it can also occur in other situations that you might not be as aware of, such as:  Diabetes  · If you have diabetes and are having problems controlling your blood glucose, hyperglycemia could occur because of some of the following reasons:  · Not following your meal plan.  · Not taking your diabetes medications or not taking it properly.  · Exercising less or doing less activity than you normally do.  · Being sick.  Pre-diabetes  · This cannot be ignored. Before people develop Type 2 diabetes, they almost always have "pre-diabetes." This is when your blood glucose levels are higher than normal, but not yet high enough to be diagnosed as diabetes. Research has shown that some long-term damage to the body, especially the heart and circulatory system, may already be occurring during pre-diabetes. If you take action to manage your blood glucose when you have pre-diabetes, you may delay or prevent Type 2 diabetes from developing.  Stress  · If you have diabetes, you may be "diet" controlled or on oral medications or insulin to control your diabetes. However, you may find that your blood glucose is higher than usual in the hospital whether you have diabetes or not. This is often referred to as "stress hyperglycemia." Stress can elevate your blood glucose. This happens because of hormones put out by the body during times of stress. If stress has been the cause of your high blood glucose, it can be followed regularly by your caregiver. That way he/she can make sure your hyperglycemia does not continue to get worse or progress to  diabetes.  Steroids  · Steroids are medications that act on the infection fighting system (immune system) to block inflammation or infection. One side effect can be a rise in blood glucose. Most people can produce enough extra insulin to allow for this rise, but for those who cannot, steroids make blood glucose levels go even higher. It is not unusual for steroid treatments to "uncover" diabetes that is developing. It is not always possible to determine if the hyperglycemia will go away after the steroids are stopped. A special blood test called an A1c is sometimes done to determine if your blood glucose was elevated before the steroids were started.  SYMPTOMS  · Thirsty.  · Frequent urination.  · Dry mouth.  · Blurred vision.  · Tired or fatigue.  · Weakness.  · Sleepy.  · Tingling in feet or leg.  DIAGNOSIS   Diagnosis is made by monitoring blood glucose in one or all of the following ways:  · A1c test. This is a chemical found in your blood.  · Fingerstick blood glucose monitoring.  · Laboratory results.  TREATMENT   First, knowing the cause of the hyperglycemia is important before the hyperglycemia can be treated. Treatment may include, but is not be limited to:  · Education.  · Change or adjustment in medications.  · Change or adjustment in meal plan.  · Treatment for an illness, infection, etc.  · More frequent blood glucose monitoring.  · Change in exercise plan.  · Decreasing or stopping steroids.  ·   Lifestyle changes.  HOME CARE INSTRUCTIONS   · Test your blood glucose as directed.  · Exercise regularly. Your caregiver will give you instructions about exercise. Pre-diabetes or diabetes which comes on with stress is helped by exercising.  · Eat wholesome, balanced meals. Eat often and at regular, fixed times. Your caregiver or nutritionist will give you a meal plan to guide your sugar intake.  · Being at an ideal weight is important. If needed, losing as little as 10 to 15 pounds may help improve blood  glucose levels.  SEEK MEDICAL CARE IF:   · You have questions about medicine, activity, or diet.  · You continue to have symptoms (problems such as increased thirst, urination, or weight gain).  SEEK IMMEDIATE MEDICAL CARE IF:   · You are vomiting or have diarrhea.  · Your breath smells fruity.  · You are breathing faster or slower.  · You are very sleepy or incoherent.  · You have numbness, tingling, or pain in your feet or hands.  · You have chest pain.  · Your symptoms get worse even though you have been following your caregiver's orders.  · If you have any other questions or concerns.  Document Released: 08/26/2000 Document Revised: 02/19/2011 Document Reviewed: 10/22/2008  [employer]® Patient Information ©2012 [employer].

## 2011-05-24 NOTE — ED Notes (Addendum)
C/o CBG 344 at home this morning.  States she has not been checking it like she is suppose to at home.  Pt states she takes Metformin as instructed. C/o feeling tired.

## 2011-05-24 NOTE — ED Provider Notes (Signed)
History     CSN: [id number]  Arrival date & time 05/24/11  1558   First MD Initiated Contact with Patient 05/24/11 1809      Chief Complaint  Patient presents with  . Hyperglycemia    HPI The patient presents with fatigue.  She notes her symptoms began gradually several weeks ago.  Since onset symptoms have been progressive.  She denies any fevers, chills, vomiting, diarrhea, anorexia, confusion, disorientation, cough.  Today she checked her blood glucose for the first time in several weeks.  The result was elevated and she presents for evaluation. Past Medical History  Diagnosis Date  . Diabetes mellitus   . Bilateral swelling of feet     Past Surgical History  Procedure Date  . Cesarean section     No family history on file.  History  Substance Use Topics  . Smoking status: Never Smoker   . Smokeless tobacco: Not on file  . Alcohol Use: No    OB History    Grav Para Term Preterm Abortions TAB SAB Ect Mult Living                  Review of Systems  Constitutional:       HPI  HENT:       HPI otherwise negative  Eyes: Negative.   Respiratory:       HPI, otherwise negative  Cardiovascular:       HPI, otherwise nmegative  Gastrointestinal: Negative for vomiting.  Genitourinary:       HPI, otherwise negative  Musculoskeletal:       HPI, otherwise negative  Skin: Negative.   Neurological: Negative for syncope.    Allergies  Review of patient's allergies indicates no known allergies.  Home Medications   Current Outpatient Rx  Name Route Sig Dispense Refill  . IBUPROFEN 800 MG PO TABS Oral Take 800 mg by mouth every 8 (eight) hours as needed. For pain    . METFORMIN HCL ER (MOD) 500 MG PO TB24 Oral Take 500 mg by mouth daily with breakfast.       BP 153/94  Pulse 104  Temp(Src) 98.5 F (36.9 C) (Oral)  Resp 20  SpO2 97%  LMP 03/31/2011  Physical Exam  Nursing note and vitals reviewed. Constitutional: She is oriented to person, place, and time.  She appears well-developed and well-nourished. No distress.  HENT:  Head: Normocephalic and atraumatic.  Eyes: Conjunctivae and EOM are normal.  Cardiovascular: Normal rate and regular rhythm.   Pulmonary/Chest: Effort normal and breath sounds normal. No stridor. No respiratory distress.  Abdominal: She exhibits no distension.  Musculoskeletal: She exhibits no edema.  Neurological: She is alert and oriented to person, place, and time. No cranial nerve deficit.  Skin: Skin is warm and dry.  Psychiatric: She has a normal mood and affect.    ED Course  Procedures (including critical care time)  Labs Reviewed  GLUCOSE, CAPILLARY - Abnormal; Notable for the following:    Glucose-Capillary 293 (*)    All other components within normal limits  CBC - Abnormal; Notable for the following:    WBC 10.8 (*)    All other components within normal limits  COMPREHENSIVE METABOLIC PANEL - Abnormal; Notable for the following:    Glucose, Bld 259 (*)    Calcium 10.7 (*)    Total Protein 8.4 (*)    All other components within normal limits  URINALYSIS, ROUTINE W REFLEX MICROSCOPIC - Abnormal; Notable for the following:  Specific Gravity, Urine 1.045 (*)    Glucose, UA >1000 (*)    Hgb urine dipstick LARGE (*)    Ketones, ur 15 (*)    Protein, ur 30 (*)    All other components within normal limits  POCT I-STAT 3, BLOOD GAS (G3P V) - Abnormal; Notable for the following:    pH, Ven 7.423 (*)    pCO2, Ven 36.0 (*)    pO2, Ven 50.0 (*)    All other components within normal limits  URINE MICROSCOPIC-ADD ON - Abnormal; Notable for the following:    Squamous Epithelial / LPF MANY (*)    All other components within normal limits  DIFFERENTIAL   No results found.   No diagnosis found.    MDM  This non-insulin-dependent diabetic presents with several weeks of fatigue.  On exam she is in no distress.  The patient is afebrile, with mild tachycardia.  The patient's labs notable for hyperglycemia,  no acidosis.  She also has proteinuria and ketonuria.  Following IV fluids, the patient's glucose was significantly better and her Sx reduced.  The patient was discharged in stable condition with instructions to increase her metformin dose to twice daily and to follow up with her primary care physician as soon as possible.   Gerhard Munch, MD 05/24/11 1958

## 2011-05-24 NOTE — ED Notes (Signed)
RX x1 given to pt, dc instruction given and explained to pt, stated understanding.

## 2012-01-19 ENCOUNTER — Other Ambulatory Visit: Payer: Self-pay | Admitting: Internal Medicine

## 2012-01-19 DIAGNOSIS — Z1231 Encounter for screening mammogram for malignant neoplasm of breast: Secondary | ICD-10-CM

## 2012-03-03 ENCOUNTER — Ambulatory Visit: Payer: No Typology Code available for payment source

## 2012-03-22 ENCOUNTER — Other Ambulatory Visit: Payer: Self-pay | Admitting: Internal Medicine

## 2012-03-22 DIAGNOSIS — Z1231 Encounter for screening mammogram for malignant neoplasm of breast: Secondary | ICD-10-CM

## 2012-04-15 ENCOUNTER — Ambulatory Visit
Admission: RE | Admit: 2012-04-15 | Discharge: 2012-04-15 | Disposition: A | Discharge status: Home / Self Care | Payer: No Typology Code available for payment source | Source: Ambulatory Visit | Attending: Internal Medicine | Admitting: Internal Medicine

## 2012-04-15 DIAGNOSIS — Z1231 Encounter for screening mammogram for malignant neoplasm of breast: Secondary | ICD-10-CM

## 2013-02-23 ENCOUNTER — Other Ambulatory Visit: Payer: Self-pay

## 2013-02-23 DIAGNOSIS — Z1231 Encounter for screening mammogram for malignant neoplasm of breast: Secondary | ICD-10-CM

## 2013-04-17 ENCOUNTER — Ambulatory Visit: Payer: No Typology Code available for payment source

## 2013-06-21 ENCOUNTER — Ambulatory Visit
Admission: RE | Admit: 2013-06-21 | Discharge: 2013-06-21 | Disposition: A | Discharge status: Home / Self Care | Payer: No Typology Code available for payment source | Source: Ambulatory Visit

## 2013-06-21 DIAGNOSIS — Z1231 Encounter for screening mammogram for malignant neoplasm of breast: Secondary | ICD-10-CM

## 2013-06-22 ENCOUNTER — Ambulatory Visit: Payer: No Typology Code available for payment source

## 2014-08-29 ENCOUNTER — Emergency Department (HOSPITAL_COMMUNITY): Payer: Commercial Managed Care - PPO

## 2014-08-29 ENCOUNTER — Emergency Department (HOSPITAL_COMMUNITY)
Admission: EM | Admit: 2014-08-29 | Discharge: 2014-08-30 | Disposition: A | Discharge status: Home / Self Care | Payer: Commercial Managed Care - PPO | Attending: Emergency Medicine | Admitting: Emergency Medicine

## 2014-08-29 ENCOUNTER — Encounter (HOSPITAL_COMMUNITY): Payer: Self-pay | Admitting: Emergency Medicine

## 2014-08-29 DIAGNOSIS — Z8739 Personal history of other diseases of the musculoskeletal system and connective tissue: Secondary | ICD-10-CM | POA: Diagnosis not present

## 2014-08-29 DIAGNOSIS — Z79899 Other long term (current) drug therapy: Secondary | ICD-10-CM | POA: Insufficient documentation

## 2014-08-29 DIAGNOSIS — E119 Type 2 diabetes mellitus without complications: Secondary | ICD-10-CM | POA: Diagnosis not present

## 2014-08-29 DIAGNOSIS — R51 Headache: Secondary | ICD-10-CM | POA: Diagnosis not present

## 2014-08-29 DIAGNOSIS — R110 Nausea: Secondary | ICD-10-CM | POA: Insufficient documentation

## 2014-08-29 DIAGNOSIS — H539 Unspecified visual disturbance: Secondary | ICD-10-CM | POA: Diagnosis not present

## 2014-08-29 DIAGNOSIS — R42 Dizziness and giddiness: Secondary | ICD-10-CM | POA: Diagnosis present

## 2014-08-29 LAB — CBC WITH DIFFERENTIAL/PLATELET
Basophils Absolute: 0 10*3/uL (ref 0.0–0.1)
Basophils Relative: 0 % (ref 0–1)
Eosinophils Absolute: 0 10*3/uL (ref 0.0–0.7)
Eosinophils Relative: 1 % (ref 0–5)
HCT: 39.8 % (ref 36.0–46.0)
Hemoglobin: 13.1 g/dL (ref 12.0–15.0)
Lymphocytes Relative: 37 % (ref 12–46)
Lymphs Abs: 2.9 10*3/uL (ref 0.7–4.0)
MCH: 28.4 pg (ref 26.0–34.0)
MCHC: 32.9 g/dL (ref 30.0–36.0)
MCV: 86.3 fL (ref 78.0–100.0)
Monocytes Absolute: 0.7 10*3/uL (ref 0.1–1.0)
Monocytes Relative: 8 % (ref 3–12)
Neutro Abs: 4.2 10*3/uL (ref 1.7–7.7)
Neutrophils Relative %: 54 % (ref 43–77)
Platelets: 339 10*3/uL (ref 150–400)
RBC: 4.61 MIL/uL (ref 3.87–5.11)
RDW: 13.8 % (ref 11.5–15.5)
WBC: 7.8 10*3/uL (ref 4.0–10.5)

## 2014-08-29 LAB — URINALYSIS, ROUTINE W REFLEX MICROSCOPIC
Bilirubin Urine: NEGATIVE
Glucose, UA: NEGATIVE mg/dL
Ketones, ur: NEGATIVE mg/dL
Leukocytes, UA: NEGATIVE
Nitrite: NEGATIVE
Protein, ur: NEGATIVE mg/dL
Specific Gravity, Urine: 1.025 (ref 1.005–1.030)
Urobilinogen, UA: 1 mg/dL (ref 0.0–1.0)
pH: 5.5 (ref 5.0–8.0)

## 2014-08-29 LAB — COMPREHENSIVE METABOLIC PANEL
ALT: 166 U/L — ABNORMAL HIGH (ref 14–54)
AST: 81 U/L — ABNORMAL HIGH (ref 15–41)
Albumin: 3.9 g/dL (ref 3.5–5.0)
Alkaline Phosphatase: 49 U/L (ref 38–126)
Anion gap: 7 (ref 5–15)
BUN: 7 mg/dL (ref 6–20)
CO2: 27 mmol/L (ref 22–32)
Calcium: 9.4 mg/dL (ref 8.9–10.3)
Chloride: 104 mmol/L (ref 101–111)
Creatinine, Ser: 0.78 mg/dL (ref 0.44–1.00)
GFR calc Af Amer: >60 mL/min (ref >60)
GFR calc non Af Amer: >60 mL/min (ref >60)
Glucose, Bld: 144 mg/dL — ABNORMAL HIGH (ref 65–99)
Potassium: 3.8 mmol/L (ref 3.5–5.1)
Sodium: 138 mmol/L (ref 135–145)
Total Bilirubin: 0.3 mg/dL (ref 0.3–1.2)
Total Protein: 7.8 g/dL (ref 6.5–8.1)

## 2014-08-29 LAB — URINE MICROSCOPIC-ADD ON

## 2014-08-29 NOTE — ED Notes (Signed)
Pt reports dizziness since Monday, states that she feels like "everything is out of focus when trying to look at something." reports nausea yesterday but thinks meclizine helped with that. Pt alert, oriented x 4.

## 2014-08-29 NOTE — ED Notes (Signed)
Pt c/o dizziness x 3 days; pt sts worse with standing; pt sts saw PCP yesterday and given meclizine with some improvement in nausea but still having dizziness

## 2014-08-29 NOTE — ED Notes (Signed)
Pt stable, ambulatory, states understanding of discharge instructions 

## 2014-08-29 NOTE — ED Provider Notes (Signed)
CSN: [id number]     Arrival date & time 08/29/14  1502 History   First MD Initiated Contact with Patient 08/29/14 1746     Chief Complaint  Patient presents with  . Dizziness     (Consider location/radiation/quality/duration/timing/severity/associated sxs/prior Treatment) HPI Comments: Patient presents with dizziness. She states she's had a three-day history of dizziness. She describes as a spinning sensation. She states it started after she got out of the shower on Monday morning. She has a little bit of intermittent headaches. She's had some nausea but no vomiting. She denies any double vision although she has had some blurry vision. She denies any speech deficits. She denies any numbness or weakness to her extremities. She does have a history of diabetes mellitus. She saw her primary care physician yesterday and was started on meclizine. She says that the nausea is improved but she still has the dizziness. She feels like there might be something else going on and wanted to get checked out again.  Patient is a 49 y.o. female presenting with dizziness.  Dizziness Associated symptoms: nausea   Associated symptoms: no blood in stool, no chest pain, no diarrhea, no headaches, no shortness of breath, no vomiting and no weakness     Past Medical History  Diagnosis Date  . Diabetes mellitus   . Bilateral swelling of feet    Past Surgical History  Procedure Laterality Date  . Cesarean section     History reviewed. No pertinent family history. History  Substance Use Topics  . Smoking status: Never Smoker   . Smokeless tobacco: Not on file  . Alcohol Use: No   OB History    No data available     Review of Systems  Constitutional: Negative for fever, chills, diaphoresis and fatigue.  HENT: Negative for congestion, rhinorrhea and sneezing.   Eyes: Positive for visual disturbance.  Respiratory: Negative for cough, chest tightness and shortness of breath.   Cardiovascular: Negative for  chest pain and leg swelling.  Gastrointestinal: Positive for nausea. Negative for vomiting, abdominal pain, diarrhea and blood in stool.  Genitourinary: Negative for frequency, hematuria, flank pain and difficulty urinating.  Musculoskeletal: Negative for back pain and arthralgias.  Skin: Negative for rash.  Neurological: Positive for dizziness. Negative for speech difficulty, weakness, numbness and headaches.      Allergies  Review of patient's allergies indicates no known allergies.  Home Medications   Prior to Admission medications   Medication Sig Start Date End Date Taking? Authorizing Provider  ibuprofen (ADVIL,MOTRIN) 800 MG tablet Take 800 mg by mouth every 8 (eight) hours as needed. For pain    Historical Provider, MD  metFORMIN (GLUCOPHAGE) 500 MG tablet Take 1 tablet (500 mg total) by mouth 2 (two) times daily with a meal. 05/24/11 05/23/12  Carmin Muskrat, MD   BP 130/69 mmHg  Pulse 89  Temp(Src) 98.9 F (37.2 C) (Oral)  Resp 20  SpO2 98% Physical Exam  Constitutional: She is oriented to person, place, and time. She appears well-developed and well-nourished.  HENT:  Head: Normocephalic and atraumatic.  Eyes: Pupils are equal, round, and reactive to light.  Positive horizontal nystagmus with a fast component to the left. She also has some rotational nystagmus  Neck: Normal range of motion. Neck supple.  Cardiovascular: Normal rate, regular rhythm and normal heart sounds.   Pulmonary/Chest: Effort normal and breath sounds normal. No respiratory distress. She has no wheezes. She has no rales. She exhibits no tenderness.  Abdominal: Soft. Bowel sounds  are normal. There is no tenderness. There is no rebound and no guarding.  Musculoskeletal: Normal range of motion. She exhibits no edema.  Lymphadenopathy:    She has no cervical adenopathy.  Neurological: She is alert and oriented to person, place, and time.  Motor 5 out of 5 all extremities, sensation grossly intact to  light touch all extremities, cranial nerves II through XII grossly intact, finger-nose intact. No pronator drift  Skin: Skin is warm and dry. No rash noted.  Psychiatric: She has a normal mood and affect.    ED Course  Procedures (including critical care time) Labs Review Results for orders placed or performed during the hospital encounter of 08/29/14  CBC with Differential  Result Value Ref Range   WBC 7.8 4.0 - 10.5 K/uL   RBC 4.61 3.87 - 5.11 MIL/uL   Hemoglobin 13.1 12.0 - 15.0 g/dL   HCT 39.8 36.0 - 46.0 %   MCV 86.3 78.0 - 100.0 fL   MCH 28.4 26.0 - 34.0 pg   MCHC 32.9 30.0 - 36.0 g/dL   RDW 13.8 11.5 - 15.5 %   Platelets 339 150 - 400 K/uL   Neutrophils Relative % 54 43 - 77 %   Neutro Abs 4.2 1.7 - 7.7 K/uL   Lymphocytes Relative 37 12 - 46 %   Lymphs Abs 2.9 0.7 - 4.0 K/uL   Monocytes Relative 8 3 - 12 %   Monocytes Absolute 0.7 0.1 - 1.0 K/uL   Eosinophils Relative 1 0 - 5 %   Eosinophils Absolute 0.0 0.0 - 0.7 K/uL   Basophils Relative 0 0 - 1 %   Basophils Absolute 0.0 0.0 - 0.1 K/uL  Comprehensive metabolic panel  Result Value Ref Range   Sodium 138 135 - 145 mmol/L   Potassium 3.8 3.5 - 5.1 mmol/L   Chloride 104 101 - 111 mmol/L   CO2 27 22 - 32 mmol/L   Glucose, Bld 144 (H) 65 - 99 mg/dL   BUN 7 6 - 20 mg/dL   Creatinine, Ser 0.78 0.44 - 1.00 mg/dL   Calcium 9.4 8.9 - 10.3 mg/dL   Total Protein 7.8 6.5 - 8.1 g/dL   Albumin 3.9 3.5 - 5.0 g/dL   AST 81 (H) 15 - 41 U/L   ALT 166 (H) 14 - 54 U/L   Alkaline Phosphatase 49 38 - 126 U/L   Total Bilirubin 0.3 0.3 - 1.2 mg/dL   GFR calc non Af Amer >60 >60 mL/min   GFR calc Af Amer >60 >60 mL/min   Anion gap 7 5 - 15  Urinalysis, Routine w reflex microscopic (not at [facility])  Result Value Ref Range   Color, Urine AMBER (A) YELLOW   APPearance CLEAR CLEAR   Specific Gravity, Urine 1.025 1.005 - 1.030   pH 5.5 5.0 - 8.0   Glucose, UA NEGATIVE NEGATIVE mg/dL   Hgb urine dipstick MODERATE (A) NEGATIVE    Bilirubin Urine NEGATIVE NEGATIVE   Ketones, ur NEGATIVE NEGATIVE mg/dL   Protein, ur NEGATIVE NEGATIVE mg/dL   Urobilinogen, UA 1.0 0.0 - 1.0 mg/dL   Nitrite NEGATIVE NEGATIVE   Leukocytes, UA NEGATIVE NEGATIVE  Urine microscopic-add on  Result Value Ref Range   Squamous Epithelial / LPF RARE RARE   RBC / HPF 0-2 <3 RBC/hpf   Bacteria, UA RARE RARE   Urine-Other MUCOUS PRESENT    Mr Brain Wo Contrast  08/29/2014   CLINICAL DATA:  Dizziness and ataxia.  History of  diabetes.  EXAM: MRI HEAD WITHOUT CONTRAST  TECHNIQUE: Multiplanar, multiecho pulse sequences of the brain and surrounding structures were obtained without intravenous contrast.  COMPARISON:  None.  FINDINGS: The ventricles and sulci are normal for patient's age. No abnormal parenchymal signal, mass lesions, mass effect. No reduced diffusion to suggest acute ischemia. No susceptibility artifact to suggest hemorrhage.  No abnormal extra-axial fluid collections. No extra-axial masses though, contrast enhanced sequences would be more sensitive. Normal major intracranial vascular flow voids seen at the skull base.  Ocular globes and orbital contents are unremarkable though not tailored for evaluation. No abnormal sellar expansion. Visualized paranasal sinuses and mastoid air cells are well-aerated. No suspicious calvarial bone marrow signal. No abnormal sellar expansion. Craniocervical junction maintained. Multiple small partially imaged lymph nodes within the neck including intra parotid lymph nodes.  IMPRESSION: No acute intracranial process; normal noncontrast MRI brain.   Electronically Signed   By: Elon Alas M.D.   On: 08/29/2014 23:04      Imaging Review Mr Brain Wo Contrast  08/29/2014   CLINICAL DATA:  Dizziness and ataxia.  History of diabetes.  EXAM: MRI HEAD WITHOUT CONTRAST  TECHNIQUE: Multiplanar, multiecho pulse sequences of the brain and surrounding structures were obtained without intravenous contrast.  COMPARISON:   None.  FINDINGS: The ventricles and sulci are normal for patient's age. No abnormal parenchymal signal, mass lesions, mass effect. No reduced diffusion to suggest acute ischemia. No susceptibility artifact to suggest hemorrhage.  No abnormal extra-axial fluid collections. No extra-axial masses though, contrast enhanced sequences would be more sensitive. Normal major intracranial vascular flow voids seen at the skull base.  Ocular globes and orbital contents are unremarkable though not tailored for evaluation. No abnormal sellar expansion. Visualized paranasal sinuses and mastoid air cells are well-aerated. No suspicious calvarial bone marrow signal. No abnormal sellar expansion. Craniocervical junction maintained. Multiple small partially imaged lymph nodes within the neck including intra parotid lymph nodes.  IMPRESSION: No acute intracranial process; normal noncontrast MRI brain.   Electronically Signed   By: Elon Alas M.D.   On: 08/29/2014 23:04     EKG Interpretation None      MDM   Final diagnoses:  Vertigo    Patient has no evidence of a cerebellar stroke. She is able to ambulate without difficulty. She was discharged home in good condition and encouraged to use her meclizine for symptomatic control. She was encouraged to make a follow-up appointment with her primary care physician.    Malvin Johns, MD 08/29/14 [phone]

## 2014-08-29 NOTE — Discharge Instructions (Signed)

## 2014-08-29 NOTE — ED Notes (Signed)
Pt not in room. Will get vitals when pt returns.

## 2014-08-29 NOTE — ED Notes (Signed)
Patient transported to MRI 

## 2015-02-22 ENCOUNTER — Encounter: Payer: Self-pay | Admitting: Family Medicine

## 2015-02-22 ENCOUNTER — Ambulatory Visit (INDEPENDENT_AMBULATORY_CARE_PROVIDER_SITE_OTHER): Payer: Commercial Managed Care - PPO | Admitting: Family Medicine

## 2015-02-22 ENCOUNTER — Ambulatory Visit (INDEPENDENT_AMBULATORY_CARE_PROVIDER_SITE_OTHER)
Admission: RE | Admit: 2015-02-22 | Discharge: 2015-02-22 | Disposition: A | Discharge status: Home / Self Care | Payer: Commercial Managed Care - PPO | Source: Ambulatory Visit | Attending: Family Medicine | Admitting: Family Medicine

## 2015-02-22 ENCOUNTER — Other Ambulatory Visit (INDEPENDENT_AMBULATORY_CARE_PROVIDER_SITE_OTHER): Payer: Commercial Managed Care - PPO

## 2015-02-22 VITALS — HR 90 | Ht 67.0 in | Wt 233.0 lb

## 2015-02-22 DIAGNOSIS — M25562 Pain in left knee: Secondary | ICD-10-CM | POA: Diagnosis not present

## 2015-02-22 DIAGNOSIS — M79672 Pain in left foot: Secondary | ICD-10-CM

## 2015-02-22 DIAGNOSIS — M722 Plantar fascial fibromatosis: Secondary | ICD-10-CM | POA: Diagnosis not present

## 2015-02-22 DIAGNOSIS — M1712 Unilateral primary osteoarthritis, left knee: Secondary | ICD-10-CM

## 2015-02-22 DIAGNOSIS — M13162 Monoarthritis, not elsewhere classified, left knee: Secondary | ICD-10-CM | POA: Diagnosis not present

## 2015-02-22 MED ORDER — IBUPROFEN-FAMOTIDINE 800-26.6 MG PO TABS: 1.0000 | ORAL_TABLET | Freq: Three times a day (TID) | ORAL | Status: DC | PRN

## 2015-02-22 NOTE — Assessment & Plan Note (Signed)
Plantar Fascitis: We reviewed that stretching is critically important to the treatment of PF. Reviewed footwear. Rigid soles have been shown to help with PF. Night splints can help. Reviewed rehab of stretching and calf raises.  Could benefit from a corticosteroid injection, orthotics, or other measures if conservative treatment fails. Discussed shoes, OTC medications as well.

## 2015-02-22 NOTE — Assessment & Plan Note (Signed)
Discussed with patient at length. Patient will try to get records from outside facility. Patient though wanted to get new x-rays can she has not had some quite some time. We will get those today to further evaluate. 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 I do not feel though with patients good range of motion of the rest of the knee that there is any other compartment the risk severely affected. Patient has had steroid injections as well as viscous supplementation with multiple rounds. Patient states that she would be due for another round with her last one being in July of this year. We discussed with her checking with her insurance about possible Orthovisc. We discussed that possibly custom bracing could be beneficial but no significant instability today. Patient has done formal physical therapy and declined doing this again. Patient will try over-the-counter medications, icing and topical anti-inflammatories. Patient will come back and see me again in 4 weeks for further evaluation.

## 2015-02-22 NOTE — Progress Notes (Signed)
[name] [address] [place], [place] [address] Phone: [phone] Subjective:    I'm seeing this patient by the request  of:  Irven Shelling, MD   CC: left foot pain.   [id number] [name] is a 49 y.o. female coming in with complaint of left foot pain. Patient states that this is been going on intermittently but seems to be have worsened over the last 48 hours. Does not remember any true injury. An states that it can be a severe pain when she starts walking. Seems to resolve after some activity of minutes. Patient states it can come back after sitting for quite some time and then starting ambulation again. Seems to be around the heel. No radiation. No weakness. Rates the severity of pain a 7 out of 10. Patient is having a dental procedure and was given ibuprofen as well as hydrocodone. Patient states when she took this the pain went away.  Has been seen another provider for left knee pain. Has been told that she has severe arthritis in her recommending a total knee replacement. Patient thinks at her age she would like to avoid this. Looking for possible further information. Patient has had multiple different injections with minimal benefit. Patient has done physical therapy one time. No bracing. Denies any locking or giving out on her but severe pain with going up and down stairs. 8/10    Past Medical History  Diagnosis Date  . Diabetes mellitus   . Bilateral swelling of feet    Past Surgical History  Procedure Laterality Date  . Cesarean section     Social History  Substance Use Topics  . Smoking status: Never Smoker   . Smokeless tobacco: None  . Alcohol Use: No   No Known Allergies No family history on file.  Past medical history, social, surgical and family history all reviewed in electronic medical record.   Review of Systems: No headache, visual changes, nausea, vomiting, diarrhea, constipation, dizziness, abdominal pain,  skin rash, fevers, chills, night sweats, weight loss, swollen lymph nodes, body aches, joint swelling, muscle aches, chest pain, shortness of breath, mood changes.   Objective Pulse 90, height 5\' 7"  (1.702 m), weight 233 lb (105.688 kg), SpO2 99 %.  General: No apparent distress alert and oriented x3 mood and affect normal, dressed appropriately.  HEENT: Pupils equal, extraocular movements intact  Respiratory: Patient's speak in full sentences and does not appear short of breath  Cardiovascular: No lower extremity edema, non tender, no erythema  Skin: Warm dry intact with no signs of infection or rash on extremities or on axial skeleton.  Abdomen: Soft nontender  Neuro: Cranial nerves II through XII are intact, neurovascularly intact in all extremities with 2+ DTRs and 2+ pulses.  Lymph: No lymphadenopathy of posterior or anterior cervical chain or axillae bilaterally.  Gait normal with good balance and coordination.  MSK:  Non tender with full range of motion and good stability and symmetric strength and tone of shoulders, elbows, wrist, hip, and ankles bilaterally.  Knee:Left Normal to inspection with no erythema or effusion or obvious bony abnormalities. Mild TTP over the anterior knee.  ROM full in flexion and extension and lower leg rotation. Ligaments with solid consistent endpoints including ACL, PCL, LCL, MCL. Negative Mcmurray's, Apley's, and Thessalonian tests. Severe painful patellar compression. Patellar glide with advance crepitus. Patellar and quadriceps tendons unremarkable. Hamstring and quadriceps strength is normal.  Contralateral knee unremarkable  Foot exam shows the patient  does have some mild overpronation of the hindfoot. Patient's does have some mild broadening of the forefoot. Would not consider there significant collection of the transverse arch. Mild hallux rigidus bilaterally. Patient is tender to palpation over the medial calcaneal region on the left  side.  Limited musculoskeletal ultrasound was performed and interpreted by Hulan Saas, M  Limited ultrasound the patient's calcaneal region does show that patient has hypoechoic changes of the plantar fascia. Minimal enlargement with a measurement of 0.89 cm. No significant tearing noted and only mild increase in Doppler flow.  Impression: Mild Plantar fasciitis  Procedure note /97110; 15 minutes spent for Therapeutic exercises as stated in above notes.  This included exercises focusing on stretching, strengthening, with significant focus on eccentric aspects. Stretches to help lengthen the lower leg and plantar fascia areas Theraband exercises for the lower leg and ankle to help strengthen the surrounding area- dorsiflexion, plantarflexion, inversion, eversion Massage rolling on the plantar surface of the foot with a frozen bottle, tennis ball or golf ball Towel or marble pick-ups to strengthen the plantar surface of the foot Weight bearing exercises to increase balance and overall stability  Proper technique shown and discussed handout in great detail with ATC.  All questions were discussed and answered.     Impression and Recommendations:     This case required medical decision making of moderate complexity.

## 2015-02-22 NOTE — Progress Notes (Signed)
Pre visit review using our clinic review tool, if applicable. No additional management support is needed unless otherwise documented below in the visit note. 

## 2015-02-22 NOTE — Patient Instructions (Signed)
Good to see you Ice 20 minutes 2 times daily. Usually after activity and before bed. Exercises 3 times a week.  Good shoes with rigid bottom.  Jalene Mullet, Merrell or New balance greater then 700 Avoid being barefoot Duexis 3 times daily as needed can help and call pharmacy to get it sent to your house.  Ask your insurance if they would cover orthovisc. We can try this it is 1 time a week injection for 4 weeks You r new vitamins Conitnue the vitamin D regularly Turmeric 500mg  twice daily Tart cherry extract at night Tylenol 500mg  3 times daily  Fish oil 2 grams daily  Xray downstairs  See me again in 4-6 weeks Happy holidays!

## 2015-02-25 ENCOUNTER — Other Ambulatory Visit: Payer: Self-pay | Admitting: *Deleted

## 2015-02-25 MED ORDER — IBUPROFEN-FAMOTIDINE 800-26.6 MG PO TABS: 1.0000 | ORAL_TABLET | Freq: Three times a day (TID) | ORAL | Status: AC | PRN

## 2015-02-25 NOTE — Telephone Encounter (Signed)
Refill done.  

## 2015-03-22 ENCOUNTER — Ambulatory Visit (INDEPENDENT_AMBULATORY_CARE_PROVIDER_SITE_OTHER): Payer: Commercial Managed Care - PPO | Admitting: Family Medicine

## 2015-03-22 ENCOUNTER — Encounter: Payer: Self-pay | Admitting: Family Medicine

## 2015-03-22 VITALS — BP 130/80 | HR 82 | Ht 67.0 in | Wt 231.0 lb

## 2015-03-22 DIAGNOSIS — M13162 Monoarthritis, not elsewhere classified, left knee: Secondary | ICD-10-CM

## 2015-03-22 DIAGNOSIS — M1712 Unilateral primary osteoarthritis, left knee: Secondary | ICD-10-CM

## 2015-03-22 DIAGNOSIS — M722 Plantar fascial fibromatosis: Secondary | ICD-10-CM | POA: Diagnosis not present

## 2015-03-22 NOTE — Patient Instructions (Signed)
Good to see you Spenco orthotics "total support" online  Stay active For the knee try turmeric 500mg  2 times daily can help Continue the pennsaid Call insurance if you want to check on the orthovisc.  See me again in 4 weeks if not perfect and we can try some type of injection on the knee.

## 2015-03-22 NOTE — Assessment & Plan Note (Signed)
Improving at this point. We discussed icing regimen and home exercises. We discussed which activities to do an which was potentially avoid. We discussed the importance of the over-the-counter orthotics with patient did not get. Patient will continue with the vitamin D supplementation. Patient come back and see me again in 4 weeks if not completely resolved.

## 2015-03-22 NOTE — Progress Notes (Signed)
  [facility] [address] [place], [place] [address] Phone: [phone] Subjective:     CC: left foot pain.   [id number] [name] is a 50 y.o. female coming in with complaint of left foot pain. Than have more of a bilateral plantar fasciitis. Patient has been doing the exercises most of the time and states that she is 90% better. States that the pain is minimal. Still some mild tightness in the morning but when she starts in moving she seems to do well.  Has been seen another provider for left knee pain. Has been told that she has severe arthritis in her recommending a total knee replacement. Patient was recently seen though did have x-rays showing moderate arthritis but not severe arthritis. Patient has more threaded changes of the lateral knee. Patient continues to be active. Still has some mild discomfort. Has tried formal physical therapy as well as viscous supplementation of multiple times.    Past Medical History  Diagnosis Date  . Diabetes mellitus   . Bilateral swelling of feet    Past Surgical History  Procedure Laterality Date  . Cesarean section     Social History  Substance Use Topics  . Smoking status: Never Smoker   . Smokeless tobacco: None  . Alcohol Use: No   No Known Allergies No family history on file.  Past medical history, social, surgical and family history all reviewed in electronic medical record.   Review of Systems: No headache, visual changes, nausea, vomiting, diarrhea, constipation, dizziness, abdominal pain, skin rash, fevers, chills, night sweats, weight loss, swollen lymph nodes, body aches, joint swelling, muscle aches, chest pain, shortness of breath, mood changes.   Objective Blood pressure 130/80, pulse 82, height 5\' 7"  (1.702 m), weight 231 lb (104.781 kg), SpO2 97 %.  General: No apparent distress alert and oriented x3 mood and affect normal, dressed appropriately.  HEENT: Pupils equal, extraocular  movements intact  Respiratory: Patient's speak in full sentences and does not appear short of breath  Cardiovascular: No lower extremity edema, non tender, no erythema  Skin: Warm dry intact with no signs of infection or rash on extremities or on axial skeleton.  Abdomen: Soft nontender  Neuro: Cranial nerves II through XII are intact, neurovascularly intact in all extremities with 2+ DTRs and 2+ pulses.  Lymph: No lymphadenopathy of posterior or anterior cervical chain or axillae bilaterally.  Gait normal with good balance and coordination.  MSK:  Non tender with full range of motion and good stability and symmetric strength and tone of shoulders, elbows, wrist, hip, and ankles bilaterally.  Knee:Left Normal to inspection with no erythema or effusion or obvious bony abnormalities. Mild TTP over the anterior knee still present  ROM full in flexion and extension and lower leg rotation. Ligaments with solid consistent endpoints including ACL, PCL, LCL, MCL. Negative Mcmurray's, Apley's, and Thessalonian tests. Moderate to severe painful patellar compression. Patellar glide with moderate crepitus. Patellar and quadriceps tendons unremarkable. Hamstring and quadriceps strength is normal.  Contralateral knee unremarkable  Foot exam shows the patient does have some mild overpronation of the hindfoot. Patient's does have some mild broadening of the forefoot. Nontender  L  Impression and Recommendations:     This case required medical decision making of moderate complexity.

## 2015-03-22 NOTE — Progress Notes (Signed)
Pre visit review using our clinic review tool, if applicable. No additional management support is needed unless otherwise documented below in the visit note. 

## 2015-03-22 NOTE — Assessment & Plan Note (Signed)
Patient overall seems to be doing relatively well. I do think the patient has some patellofemoral arthritis and patient's x-ray show very minimal to moderate arthritis of this area. We discussed different treatment options including injections as well as the potential of a repeat of viscous supple mentation. At this moment with patient being able to be fairly active I do not think that a joint replacement is necessary especially at her age. We discussed over-the-counter medications we discussed how the orthotics could help with alignment and decreased the pain. Patient and will come back and see me again in 4 weeks and we'll if needed and we make consider injection.  Spent  25 minutes with patient face-to-face and had greater than 50% of counseling including as described above in assessment and plan.

## 2015-04-22 ENCOUNTER — Ambulatory Visit: Payer: Commercial Managed Care - PPO | Admitting: Family Medicine

## 2015-06-06 ENCOUNTER — Ambulatory Visit: Payer: Commercial Managed Care - PPO | Admitting: Family Medicine

## 2016-07-20 ENCOUNTER — Emergency Department (HOSPITAL_COMMUNITY): Payer: Commercial Managed Care - PPO

## 2016-07-20 ENCOUNTER — Emergency Department (HOSPITAL_COMMUNITY)
Admission: EM | Admit: 2016-07-20 | Discharge: 2016-07-20 | Disposition: A | Discharge status: Home / Self Care | Payer: Commercial Managed Care - PPO | Attending: Emergency Medicine | Admitting: Emergency Medicine

## 2016-07-20 ENCOUNTER — Encounter (HOSPITAL_COMMUNITY): Payer: Self-pay | Admitting: Emergency Medicine

## 2016-07-20 DIAGNOSIS — M549 Dorsalgia, unspecified: Secondary | ICD-10-CM | POA: Diagnosis not present

## 2016-07-20 DIAGNOSIS — M546 Pain in thoracic spine: Secondary | ICD-10-CM

## 2016-07-20 DIAGNOSIS — Z7984 Long term (current) use of oral hypoglycemic drugs: Secondary | ICD-10-CM | POA: Insufficient documentation

## 2016-07-20 DIAGNOSIS — E119 Type 2 diabetes mellitus without complications: Secondary | ICD-10-CM | POA: Insufficient documentation

## 2016-07-20 DIAGNOSIS — Z79899 Other long term (current) drug therapy: Secondary | ICD-10-CM | POA: Insufficient documentation

## 2016-07-20 MED ORDER — NAPROXEN 375 MG PO TABS: 375.0000 mg | ORAL_TABLET | Freq: Two times a day (BID) | ORAL | 0 refills | Status: AC

## 2016-07-20 MED ORDER — METHOCARBAMOL 500 MG PO TABS: 500.0000 mg | ORAL_TABLET | Freq: Two times a day (BID) | ORAL | 0 refills | Status: AC

## 2016-07-20 MED ORDER — NAPROXEN 250 MG PO TABS
375.0000 mg | ORAL_TABLET | Freq: Once | ORAL | Status: AC
  Administered 2016-07-20: 375 mg via ORAL
  Filled 2016-07-20: qty 2

## 2016-07-20 NOTE — ED Notes (Signed)
Patient returned from xray.

## 2016-07-20 NOTE — ED Provider Notes (Signed)
[facility] DEPT Provider Note   CSN: [id number] Arrival date & time: 07/20/16  0935   By signing my name below, I, Hilbert Odor, attest that this documentation has been prepared under the direction and in the presence of Eliezer Mccoy, PA-C. Electronically Signed: Hilbert Odor, Scribe. 07/20/16. 11:35 AM. History   Chief Complaint Chief Complaint  Patient presents with  . Back Pain    The history is provided by the patient. No language interpreter was used.  HPI Comments: [name] is a 51 y.o. female with hx of diabetes who presents to the Emergency Department complaining of non-radiating left sided back pain for the past 3 days. She rates her pain 8/10. Pain worsens with movement. She reports sleeping on a pull out sofa prior to the onset of her pain which she believes is related. She denies hx of back pain, cancer or recent surgery/procedure to back. She denies any recent injuries. She also denies any urinary symptoms, abdominal pain, saddle anesthesia, urinary incontinence, or bowel incontinence. No numbness or tingling in her legs.  Past Medical History:  Diagnosis Date  . Bilateral swelling of feet   . Diabetes mellitus     Patient Active Problem List   Diagnosis Date Noted  . Plantar fasciitis 02/22/2015  . 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 CESAREAN SECTION      OB History    No data available       Home Medications    Prior to Admission medications   Medication Sig Start Date End Date Taking? Authorizing Provider  FLUZONE QUADRIVALENT injection  01/15/15   [provider]  HYDROcodone-ibuprofen (VICOPROFEN) 7.5-200 MG tablet  02/21/15   [provider]  Ibuprofen-Famotidine 800-26.6 MG TABS Take 1 tablet by mouth 3 (three) times daily as needed. 02/25/15   Lyndal Pulley, DO  losartan (COZAAR) 100 MG tablet Take 100 mg by mouth daily. 01/10/15    [provider]  losartan (COZAAR) 50 MG tablet Take 50 mg by mouth daily. 11/24/14   [provider]  metFORMIN (GLUCOPHAGE) 1000 MG tablet TAKE 1 TABLET BY MOUTH IN THE MORNING AND 1/2 AT NIGHT 02/02/15   [provider]  metFORMIN (GLUCOPHAGE) 500 MG tablet Take 1 tablet (500 mg total) by mouth 2 (two) times daily with a meal. 05/24/11 05/23/12  Carmin Muskrat, MD  methocarbamol (ROBAXIN) 500 MG tablet Take 1 tablet (500 mg total) by mouth 2 (two) times daily. 07/20/16   Antario Yasuda, Bea Graff, PA-C  naproxen (NAPROSYN) 375 MG tablet Take 1 tablet (375 mg total) by mouth 2 (two) times daily. 07/20/16   Kanda Deluna, Bea Graff, PA-C  Vitamin D, Ergocalciferol, (DRISDOL) 50000 UNITS CAPS capsule Take 50,000 Units by mouth once a week. 11/23/14   [provider]    Family History History reviewed. No pertinent family history.  Social History Social History  Substance Use Topics  . Smoking status: Never Smoker  . Smokeless tobacco: Never Used  . Alcohol use No     Allergies   Patient has no known allergies.   Review of Systems Review of Systems  Constitutional: Negative for fever.  Gastrointestinal:       Negative for bowel incontinence.  Genitourinary: Negative for enuresis.  Musculoskeletal: Positive for back pain. Negative for neck pain.  Neurological: Negative for weakness and numbness.   Physical Exam Updated Vital Signs BP (!) 162/85 (BP Location: Left Arm)   Pulse  84   Temp 98.7 F (37.1 C) (Oral)   Resp 18   Ht 5\' 7"  (1.702 m)   Wt 104.3 kg   LMP 06/17/2013   SpO2 100%   BMI 36.02 kg/m   Physical Exam  Constitutional: She appears well-developed and well-nourished. No distress.  HENT:  Head: Normocephalic and atraumatic.  Mouth/Throat: Oropharynx is clear and moist. No oropharyngeal exudate.  Eyes: Conjunctivae are normal. Pupils are equal, round, and reactive to light. Right eye exhibits no discharge. Left eye exhibits no discharge. No  scleral icterus.  Neck: Normal range of motion. Neck supple. No thyromegaly present.  Cardiovascular: Normal rate, regular rhythm, normal heart sounds and intact distal pulses.  Exam reveals no gallop and no friction rub.   No murmur heard. Pulmonary/Chest: Effort normal and breath sounds normal. No stridor. No respiratory distress. She has no wheezes. She has no rales.  Abdominal: Soft. Bowel sounds are normal. She exhibits no distension. There is no tenderness. There is no rebound and no guarding.  Musculoskeletal: She exhibits no edema.       Back:  Left sided thoracic tenderness. Normal sensation and 5/5 strength to all 4 extremities. Equal bilateral grip strength. No midline tenderness.  Lymphadenopathy:    She has no cervical adenopathy.  Neurological: She is alert. Coordination normal.  Skin: Skin is warm and dry. No rash noted. She is not diaphoretic. No pallor.  Psychiatric: She has a normal mood and affect.  Nursing note and vitals reviewed.  ED Treatments / Results  DIAGNOSTIC STUDIES: Oxygen Saturation is 100% on RA, normal by my interpretation.    COORDINATION OF CARE: 11:27 AM Discussed treatment plan with pt at bedside and pt agreed to plan. I will check her x-ray for any abnormalities.  Labs (all labs ordered are listed, but only abnormal results are displayed) Labs Reviewed - No data to display  EKG  EKG Interpretation None       Radiology Dg Thoracic Spine 2 View  Result Date: 07/20/2016 CLINICAL DATA:  Left side pain.  No known injury. EXAM: THORACIC SPINE 2 VIEWS COMPARISON:  None. FINDINGS: Slight generalized rightward scoliosis in the thoracic spine. No subluxation or fracture. Early spurring in the mid and lower thoracic spine. IMPRESSION: No acute findings.  Slight generalized rightward scoliosis. Electronically Signed   By: Rolm Baptise M.D.   On: 07/20/2016 12:15    Procedures Procedures (including critical care time)  Medications Ordered in  ED Medications  naproxen (NAPROSYN) tablet 375 mg (375 mg Oral Given 07/20/16 1143)     Initial Impression / Assessment and Plan / ED Course  I have reviewed the triage vital signs and the nursing notes.  Pertinent labs & imaging results that were available during my care of the patient were reviewed by me and considered in my medical decision making (see chart for details).     Patient with back pain.  No neurological deficits and normal neuro exam.  Patient is ambulatory.  No loss of bowel or bladder control.  No concern for cauda equina.  No fever, night sweats, weight loss, h/o cancer, IVDA, no recent procedure to back. No urinary symptoms suggestive of UTI.  Supportive care, Including heat, stretches Naprosyn, Robaxin, discussed. Return precaution discussed. Follow up with PCP if symptoms are not improving over the next week.Patient understands and agrees with plan.  Final Clinical Impressions(s) / ED Diagnoses   Final diagnoses:  Acute left-sided thoracic back pain    New Prescriptions Discharge Medication  List as of 07/20/2016 12:48 PM    START taking these medications   Details  methocarbamol (ROBAXIN) 500 MG tablet Take 1 tablet (500 mg total) by mouth 2 (two) times daily., Starting Mon 07/20/2016, Print    naproxen (NAPROSYN) 375 MG tablet Take 1 tablet (375 mg total) by mouth 2 (two) times daily., Starting Mon 07/20/2016, Print       I personally performed the services described in this documentation, which was scribed in my presence. The recorded information has been reviewed and is accurate.    Frederica Kuster, PA-C 07/20/16 1538    Virgel Manifold, MD 07/21/16 2108

## 2016-07-20 NOTE — ED Triage Notes (Signed)
Pt to ER by private vehicle for evaluation of left lateral back pain onset after sleeping on a pull out couch. Pt reports worse with movement. A/o x4. NAD. Ambulatory without difficulty.

## 2016-07-20 NOTE — ED Notes (Signed)
Declined W/C at D/C and was escorted to lobby by RN. 

## 2016-07-20 NOTE — Discharge Instructions (Signed)
Medications: Naprosyn, Robaxin  Treatment: Take Naprosyn twice daily for 10 days. Take Robaxin twice daily as needed for muscle pain and spasms. Do not drive or operate machinery when taking this medication. Use a heating pad to your back 3-4 times daily alternating 20 minutes on, 20 minutes off. Attempt the back exercises and stretches attached 1-2 times daily as tolerated.  Follow-up: Please follow-up with your primary care provider if your symptoms are not improving over the next 3-5 days. Please return to emergency department if you develop any new or worsening symptoms including complete numbness of the extremities, numbness in your groin, loss of bowel or bladder control, or any other new or concerning symptoms.

## 2016-10-28 ENCOUNTER — Other Ambulatory Visit: Payer: Self-pay | Admitting: Internal Medicine

## 2016-10-28 DIAGNOSIS — Z1231 Encounter for screening mammogram for malignant neoplasm of breast: Secondary | ICD-10-CM

## 2016-10-29 DIAGNOSIS — E1165 Type 2 diabetes mellitus with hyperglycemia: Secondary | ICD-10-CM | POA: Diagnosis not present

## 2016-10-29 DIAGNOSIS — E119 Type 2 diabetes mellitus without complications: Secondary | ICD-10-CM | POA: Diagnosis not present

## 2016-10-29 DIAGNOSIS — Z7984 Long term (current) use of oral hypoglycemic drugs: Secondary | ICD-10-CM | POA: Diagnosis not present

## 2016-11-19 ENCOUNTER — Ambulatory Visit: Payer: Commercial Managed Care - PPO | Admitting: Skilled Nursing Facility1

## 2016-11-24 ENCOUNTER — Encounter: Payer: Self-pay | Admitting: Skilled Nursing Facility1

## 2016-11-24 ENCOUNTER — Encounter: Payer: Commercial Managed Care - PPO | Attending: Internal Medicine | Admitting: Skilled Nursing Facility1

## 2016-11-24 DIAGNOSIS — E119 Type 2 diabetes mellitus without complications: Secondary | ICD-10-CM | POA: Diagnosis not present

## 2016-11-24 DIAGNOSIS — Z713 Dietary counseling and surveillance: Secondary | ICD-10-CM | POA: Diagnosis not present

## 2016-11-24 NOTE — Patient Instructions (Signed)
-  Aim for 3 bottles full of fluid: water, unsweet tea, crystal light flavorings   -Check your feet every day looking for anything that was not there the day before  -Prick your finger on the side rather than the middle  -Play the Wii 15-20 minutes 3 days a week  -Have some protein after a low blood sugar event   -80-130 fasting and below 180 2 hours after eating   -Aim for 2 carb choices per meal (30g) AND 1 carb choice (15g) for snacks

## 2016-11-24 NOTE — Progress Notes (Signed)
pts A1C 8.8. Pt states a health coach put her on 100 grams carbohydrate diet. Pt states her blood sugar was 68 the other day.  Diabetes Self-Management Education  Visit Type: First/Initial  11/24/2016  [name], identified by name and date of birth, is a 51 y.o. female with a diagnosis of Diabetes: Type 2.   ASSESSMENT  Height 5\' 7"  (1.702 m), weight 227 lb 3.2 oz (103.1 kg), last menstrual period 06/17/2013. Body mass index is 35.58 kg/m.      Diabetes Self-Management Education - 11/24/16 1633      Visit Information   Visit Type First/Initial     Initial Visit   Diabetes Type Type 2   Are you currently following a meal plan? No   Are you taking your medications as prescribed? No     Health Coping   How would you rate your overall health? Fair     Psychosocial Assessment   Patient Belief/Attitude about Diabetes Denial     Pre-Education Assessment   Patient understands the diabetes disease and treatment process. Needs Instruction   Patient understands incorporating nutritional management into lifestyle. Needs Instruction   Patient undertands incorporating physical activity into lifestyle. Needs Instruction   Patient understands using medications safely. Needs Instruction   Patient understands monitoring blood glucose, interpreting and using results Needs Instruction   Patient understands prevention, detection, and treatment of acute complications. Needs Instruction   Patient understands prevention, detection, and treatment of chronic complications. Needs Instruction   Patient understands how to develop strategies to address psychosocial issues. Needs Instruction   Patient understands how to develop strategies to promote health/change behavior. Needs Instruction     Complications   Last HgB A1C per patient/outside source 8.8 %   How often do you check your blood sugar? 1-2 times/day   Fasting Blood glucose range (mg/dL) 130-179   Postprandial Blood glucose  range (mg/dL) 130-179   Number of hypoglycemic episodes per month 1   Can you tell when your blood sugar is low? Yes   What do you do if your blood sugar is low? drank some oarnage juice and ate grapes    Have you had a dilated eye exam in the past 12 months? Yes   Have you had a dental exam in the past 12 months? Yes   Are you checking your feet? Yes   How many days per week are you checking your feet? 1     Dietary Intake   Breakfast instant grits and boiled egg and cheese and bacon or sausage    Lunch meat and vegetable or salad or frozen meal   Snack (afternoon) plum or greek yogurt   Dinner green beans and protein and peas   Snack (evening)     Beverage(s) wate, unsweet tea     Exercise   Exercise Type ADL's     Patient Education   Previous Diabetes Education No   Disease state  Factors that contribute to the development of diabetes;Explored patient's options for treatment of their diabetes   Nutrition management  Role of diet in the treatment of diabetes and the relationship between the three main macronutrients and blood glucose level;Food label reading, portion sizes and measuring food.;Carbohydrate counting;Reviewed blood glucose goals for pre and post meals and how to evaluate the patients' food intake on their blood glucose level.;Meal timing in regards to the patients' current diabetes medication.;Information on hints to eating out and maintain blood glucose control.   Physical activity and  exercise  Role of exercise on diabetes management, blood pressure control and cardiac health.   Monitoring Taught/evaluated SMBG meter.;Daily foot exams;Yearly dilated eye exam   Acute complications Taught treatment of hypoglycemia - the 15 rule.   Chronic complications Assessed and discussed foot care and prevention of foot problems;Relationship between chronic complications and blood glucose control   Psychosocial adjustment Worked with patient to identify barriers to care and  solutions;Role of stress on diabetes     Individualized Goals (developed by patient)   Nutrition General guidelines for healthy choices and portions discussed;Follow meal plan discussed   Physical Activity Exercise 3-5 times per week;15 minutes per day     Post-Education Assessment   Patient understands the diabetes disease and treatment process. Demonstrates understanding / competency   Patient understands incorporating nutritional management into lifestyle. Demonstrates understanding / competency   Patient undertands incorporating physical activity into lifestyle. Demonstrates understanding / competency   Patient understands using medications safely. Demonstrates understanding / competency   Patient understands monitoring blood glucose, interpreting and using results Demonstrates understanding / competency   Patient understands prevention, detection, and treatment of acute complications. Demonstrates understanding / competency   Patient understands prevention, detection, and treatment of chronic complications. Demonstrates understanding / competency   Patient understands how to develop strategies to address psychosocial issues. Demonstrates understanding / competency   Patient understands how to develop strategies to promote health/change behavior. Demonstrates understanding / competency     Outcomes   Expected Outcomes Demonstrated interest in learning. Expect positive outcomes   Future DMSE PRN   Program Status Completed      Individualized Plan for Diabetes Self-Management Training:   Learning Objective:  Patient will have a greater understanding of diabetes self-management. Patient education plan is to attend individual and/or group sessions per assessed needs and concerns.   Plan:   Patient Instructions  -Aim for 3 bottles full of fluid: water, unsweet tea, crystal light flavorings   -Check your feet every day looking for anything that was not there the day before  -Prick  your finger on the side rather than the middle  -Play the Wii 15-20 minutes 3 days a week  -Have some protein after a low blood sugar event   -80-130 fasting and below 180 2 hours after eating   -Aim for 2 carb choices per meal (30g) AND 1 carb choice (15g) for snacks      Expected Outcomes:  Demonstrated interest in learning. Expect positive outcomes  Education material provided: Living Well with Diabetes, Meal plan card, My Plate and Snack sheet  If problems or questions, patient to contact team via:  Phone  Future DSME appointment: PRN

## 2016-12-14 DIAGNOSIS — I10 Essential (primary) hypertension: Secondary | ICD-10-CM | POA: Diagnosis not present

## 2016-12-14 DIAGNOSIS — Z0000 Encounter for general adult medical examination without abnormal findings: Secondary | ICD-10-CM | POA: Diagnosis not present

## 2016-12-14 DIAGNOSIS — E119 Type 2 diabetes mellitus without complications: Secondary | ICD-10-CM | POA: Diagnosis not present

## 2016-12-14 DIAGNOSIS — Z23 Encounter for immunization: Secondary | ICD-10-CM | POA: Diagnosis not present

## 2016-12-18 ENCOUNTER — Ambulatory Visit
Admission: RE | Admit: 2016-12-18 | Discharge: 2016-12-18 | Disposition: A | Discharge status: Home / Self Care | Payer: Commercial Managed Care - PPO | Source: Ambulatory Visit | Attending: Internal Medicine | Admitting: Internal Medicine

## 2016-12-18 DIAGNOSIS — Z1231 Encounter for screening mammogram for malignant neoplasm of breast: Secondary | ICD-10-CM

## 2016-12-22 ENCOUNTER — Other Ambulatory Visit: Payer: Self-pay | Admitting: Internal Medicine

## 2016-12-22 DIAGNOSIS — R928 Other abnormal and inconclusive findings on diagnostic imaging of breast: Secondary | ICD-10-CM

## 2016-12-25 ENCOUNTER — Ambulatory Visit
Admission: RE | Admit: 2016-12-25 | Discharge: 2016-12-25 | Disposition: A | Discharge status: Home / Self Care | Payer: Commercial Managed Care - PPO | Source: Ambulatory Visit | Attending: Internal Medicine | Admitting: Internal Medicine

## 2016-12-25 DIAGNOSIS — R928 Other abnormal and inconclusive findings on diagnostic imaging of breast: Secondary | ICD-10-CM

## 2016-12-25 DIAGNOSIS — N6321 Unspecified lump in the left breast, upper outer quadrant: Secondary | ICD-10-CM | POA: Diagnosis not present

## 2017-03-03 DIAGNOSIS — Z01419 Encounter for gynecological examination (general) (routine) without abnormal findings: Secondary | ICD-10-CM | POA: Diagnosis not present

## 2017-04-05 DIAGNOSIS — I10 Essential (primary) hypertension: Secondary | ICD-10-CM | POA: Diagnosis not present

## 2017-04-05 DIAGNOSIS — E669 Obesity, unspecified: Secondary | ICD-10-CM | POA: Diagnosis not present

## 2017-04-05 DIAGNOSIS — E119 Type 2 diabetes mellitus without complications: Secondary | ICD-10-CM | POA: Diagnosis not present

## 2017-04-07 DIAGNOSIS — H401131 Primary open-angle glaucoma, bilateral, mild stage: Secondary | ICD-10-CM | POA: Diagnosis not present

## 2017-05-13 DIAGNOSIS — H401131 Primary open-angle glaucoma, bilateral, mild stage: Secondary | ICD-10-CM | POA: Diagnosis not present

## 2017-08-19 DIAGNOSIS — E1165 Type 2 diabetes mellitus with hyperglycemia: Secondary | ICD-10-CM | POA: Diagnosis not present

## 2017-08-19 DIAGNOSIS — E119 Type 2 diabetes mellitus without complications: Secondary | ICD-10-CM | POA: Diagnosis not present

## 2017-08-19 DIAGNOSIS — I10 Essential (primary) hypertension: Secondary | ICD-10-CM | POA: Diagnosis not present

## 2017-11-09 DIAGNOSIS — H401131 Primary open-angle glaucoma, bilateral, mild stage: Secondary | ICD-10-CM | POA: Diagnosis not present

## 2017-12-17 ENCOUNTER — Other Ambulatory Visit: Payer: Self-pay | Admitting: Internal Medicine

## 2017-12-17 DIAGNOSIS — E1169 Type 2 diabetes mellitus with other specified complication: Secondary | ICD-10-CM | POA: Diagnosis not present

## 2017-12-17 DIAGNOSIS — Z0000 Encounter for general adult medical examination without abnormal findings: Secondary | ICD-10-CM | POA: Diagnosis not present

## 2017-12-17 DIAGNOSIS — Z1231 Encounter for screening mammogram for malignant neoplasm of breast: Secondary | ICD-10-CM

## 2017-12-17 DIAGNOSIS — Z23 Encounter for immunization: Secondary | ICD-10-CM | POA: Diagnosis not present

## 2017-12-17 DIAGNOSIS — Z1159 Encounter for screening for other viral diseases: Secondary | ICD-10-CM | POA: Diagnosis not present

## 2017-12-17 DIAGNOSIS — I10 Essential (primary) hypertension: Secondary | ICD-10-CM | POA: Diagnosis not present

## 2017-12-17 DIAGNOSIS — E559 Vitamin D deficiency, unspecified: Secondary | ICD-10-CM | POA: Diagnosis not present

## 2018-01-26 ENCOUNTER — Ambulatory Visit
Admission: RE | Admit: 2018-01-26 | Discharge: 2018-01-26 | Disposition: A | Discharge status: Home / Self Care | Payer: Commercial Managed Care - PPO | Source: Ambulatory Visit | Attending: Internal Medicine | Admitting: Internal Medicine

## 2018-01-26 DIAGNOSIS — Z1231 Encounter for screening mammogram for malignant neoplasm of breast: Secondary | ICD-10-CM | POA: Diagnosis not present

## 2018-03-21 DIAGNOSIS — R002 Palpitations: Secondary | ICD-10-CM | POA: Diagnosis not present

## 2018-03-27 DIAGNOSIS — J188 Other pneumonia, unspecified organism: Secondary | ICD-10-CM | POA: Diagnosis not present

## 2018-03-28 DIAGNOSIS — R05 Cough: Secondary | ICD-10-CM | POA: Diagnosis not present

## 2018-04-14 DIAGNOSIS — R002 Palpitations: Secondary | ICD-10-CM | POA: Diagnosis not present

## 2018-04-15 DIAGNOSIS — Z1211 Encounter for screening for malignant neoplasm of colon: Secondary | ICD-10-CM | POA: Diagnosis not present

## 2018-04-15 DIAGNOSIS — Z8371 Family history of colonic polyps: Secondary | ICD-10-CM | POA: Diagnosis not present

## 2018-04-20 ENCOUNTER — Other Ambulatory Visit: Payer: Self-pay | Admitting: Internal Medicine

## 2018-04-20 ENCOUNTER — Ambulatory Visit
Admission: RE | Admit: 2018-04-20 | Discharge: 2018-04-20 | Disposition: A | Discharge status: Home / Self Care | Payer: Commercial Managed Care - PPO | Source: Ambulatory Visit | Attending: Internal Medicine | Admitting: Internal Medicine

## 2018-04-20 DIAGNOSIS — J189 Pneumonia, unspecified organism: Secondary | ICD-10-CM

## 2018-04-20 DIAGNOSIS — E785 Hyperlipidemia, unspecified: Secondary | ICD-10-CM | POA: Diagnosis not present

## 2018-04-20 DIAGNOSIS — I10 Essential (primary) hypertension: Secondary | ICD-10-CM | POA: Diagnosis not present

## 2018-04-20 DIAGNOSIS — E1169 Type 2 diabetes mellitus with other specified complication: Secondary | ICD-10-CM | POA: Diagnosis not present

## 2018-05-03 DIAGNOSIS — Z01419 Encounter for gynecological examination (general) (routine) without abnormal findings: Secondary | ICD-10-CM | POA: Diagnosis not present

## 2018-05-03 DIAGNOSIS — Z6835 Body mass index (BMI) 35.0-35.9, adult: Secondary | ICD-10-CM | POA: Diagnosis not present

## 2018-05-23 DIAGNOSIS — Z8371 Family history of colonic polyps: Secondary | ICD-10-CM | POA: Diagnosis not present

## 2018-05-23 DIAGNOSIS — D120 Benign neoplasm of cecum: Secondary | ICD-10-CM | POA: Diagnosis not present

## 2018-05-23 DIAGNOSIS — Z1211 Encounter for screening for malignant neoplasm of colon: Secondary | ICD-10-CM | POA: Diagnosis not present

## 2018-08-24 DIAGNOSIS — E1169 Type 2 diabetes mellitus with other specified complication: Secondary | ICD-10-CM | POA: Diagnosis not present

## 2018-08-24 DIAGNOSIS — I10 Essential (primary) hypertension: Secondary | ICD-10-CM | POA: Diagnosis not present

## 2018-12-22 DIAGNOSIS — I10 Essential (primary) hypertension: Secondary | ICD-10-CM | POA: Diagnosis not present

## 2018-12-22 DIAGNOSIS — E559 Vitamin D deficiency, unspecified: Secondary | ICD-10-CM | POA: Diagnosis not present

## 2018-12-22 DIAGNOSIS — E785 Hyperlipidemia, unspecified: Secondary | ICD-10-CM | POA: Diagnosis not present

## 2019-01-16 ENCOUNTER — Other Ambulatory Visit: Payer: Self-pay | Admitting: Internal Medicine

## 2019-01-16 DIAGNOSIS — Z1231 Encounter for screening mammogram for malignant neoplasm of breast: Secondary | ICD-10-CM

## 2019-03-08 ENCOUNTER — Ambulatory Visit
Admission: RE | Admit: 2019-03-08 | Discharge: 2019-03-08 | Disposition: A | Discharge status: Home / Self Care | Payer: Commercial Managed Care - PPO | Source: Ambulatory Visit | Attending: Internal Medicine | Admitting: Internal Medicine

## 2019-03-08 ENCOUNTER — Other Ambulatory Visit: Payer: Self-pay

## 2019-03-08 DIAGNOSIS — Z1231 Encounter for screening mammogram for malignant neoplasm of breast: Secondary | ICD-10-CM

## 2019-06-23 DIAGNOSIS — E1169 Type 2 diabetes mellitus with other specified complication: Secondary | ICD-10-CM | POA: Diagnosis not present

## 2019-06-23 DIAGNOSIS — Z7189 Other specified counseling: Secondary | ICD-10-CM | POA: Diagnosis not present

## 2019-06-23 DIAGNOSIS — I10 Essential (primary) hypertension: Secondary | ICD-10-CM | POA: Diagnosis not present

## 2019-06-24 ENCOUNTER — Ambulatory Visit: Payer: Federal, State, Local not specified - PPO | Attending: Internal Medicine

## 2019-06-24 DIAGNOSIS — Z23 Encounter for immunization: Secondary | ICD-10-CM

## 2019-06-24 NOTE — Progress Notes (Signed)
   Covid-19 Vaccination Clinic  Name:  Suzonne Nand    MRN: [id number] DOB: 1966-02-06  06/24/2019  Ms. Fickling was observed post Covid-19 immunization for 15 minutes without incident. She was provided with Vaccine Information Sheet and instruction to access the V-Safe system.   Ms. Siemer was instructed to call 911 with any severe reactions post vaccine: Marland Kitchen Difficulty breathing  . Swelling of face and throat  . A fast heartbeat  . A bad rash all over body  . Dizziness and weakness   Immunizations Administered    Name Date Dose VIS Date Route   Pfizer COVID-19 Vaccine 06/24/2019  1:25 PM 0.3 mL 02/24/2019 Intramuscular   Manufacturer: [place]   Lot: [id number]   [place]: [id number]

## 2019-07-17 ENCOUNTER — Ambulatory Visit: Payer: Federal, State, Local not specified - PPO | Attending: Internal Medicine

## 2019-07-17 DIAGNOSIS — Z23 Encounter for immunization: Secondary | ICD-10-CM

## 2019-07-17 NOTE — Progress Notes (Signed)
   Covid-19 Vaccination Clinic  Name:  Aubyn Fehring    MRN: [id number] DOB: 1966-01-18  07/17/2019  Ms. Gartley was observed post Covid-19 immunization for 15 minutes without incident. She was provided with Vaccine Information Sheet and instruction to access the V-Safe system.   Ms. Kowall was instructed to call 911 with any severe reactions post vaccine: Marland Kitchen Difficulty breathing  . Swelling of face and throat  . A fast heartbeat  . A bad rash all over body  . Dizziness and weakness   Immunizations Administered    Name Date Dose VIS Date Route   Pfizer COVID-19 Vaccine 07/17/2019  1:21 PM 0.3 mL 05/10/2018 Intramuscular   Manufacturer: [place]   Lot: [id number]   [place]: [id number]

## 2019-09-13 DIAGNOSIS — Z6836 Body mass index (BMI) 36.0-36.9, adult: Secondary | ICD-10-CM | POA: Diagnosis not present

## 2019-09-13 DIAGNOSIS — Z01419 Encounter for gynecological examination (general) (routine) without abnormal findings: Secondary | ICD-10-CM | POA: Diagnosis not present

## 2019-09-13 DIAGNOSIS — Z1151 Encounter for screening for human papillomavirus (HPV): Secondary | ICD-10-CM | POA: Diagnosis not present

## 2019-10-04 DIAGNOSIS — N951 Menopausal and female climacteric states: Secondary | ICD-10-CM | POA: Diagnosis not present

## 2019-10-04 DIAGNOSIS — E782 Mixed hyperlipidemia: Secondary | ICD-10-CM | POA: Diagnosis not present

## 2019-10-04 DIAGNOSIS — Z6835 Body mass index (BMI) 35.0-35.9, adult: Secondary | ICD-10-CM | POA: Diagnosis not present

## 2019-10-04 DIAGNOSIS — I10 Essential (primary) hypertension: Secondary | ICD-10-CM | POA: Diagnosis not present

## 2019-10-04 DIAGNOSIS — E119 Type 2 diabetes mellitus without complications: Secondary | ICD-10-CM | POA: Diagnosis not present

## 2019-10-11 DIAGNOSIS — N951 Menopausal and female climacteric states: Secondary | ICD-10-CM | POA: Diagnosis not present

## 2019-10-11 DIAGNOSIS — Z1339 Encounter for screening examination for other mental health and behavioral disorders: Secondary | ICD-10-CM | POA: Diagnosis not present

## 2019-10-11 DIAGNOSIS — R6882 Decreased libido: Secondary | ICD-10-CM | POA: Diagnosis not present

## 2019-10-11 DIAGNOSIS — Z1331 Encounter for screening for depression: Secondary | ICD-10-CM | POA: Diagnosis not present

## 2019-10-11 DIAGNOSIS — R232 Flushing: Secondary | ICD-10-CM | POA: Diagnosis not present

## 2019-10-11 DIAGNOSIS — M2559 Pain in other specified joint: Secondary | ICD-10-CM | POA: Diagnosis not present

## 2019-12-27 DIAGNOSIS — I10 Essential (primary) hypertension: Secondary | ICD-10-CM | POA: Diagnosis not present

## 2019-12-27 DIAGNOSIS — Z0000 Encounter for general adult medical examination without abnormal findings: Secondary | ICD-10-CM | POA: Diagnosis not present

## 2019-12-27 DIAGNOSIS — E785 Hyperlipidemia, unspecified: Secondary | ICD-10-CM | POA: Diagnosis not present

## 2019-12-27 DIAGNOSIS — E1169 Type 2 diabetes mellitus with other specified complication: Secondary | ICD-10-CM | POA: Diagnosis not present

## 2019-12-27 DIAGNOSIS — Z23 Encounter for immunization: Secondary | ICD-10-CM | POA: Diagnosis not present

## 2020-02-18 IMAGING — MG DIGITAL SCREENING BILATERAL MAMMOGRAM WITH TOMO AND CAD
6 of 10 series · 6 of 30 positions shown · non-contrast
Comparison: Previous exam(s).

CLINICAL DATA: Screening.

EXAM:
DIGITAL SCREENING BILATERAL MAMMOGRAM WITH TOMO AND CAD

[R MLO synth-2D (1 of 2)]
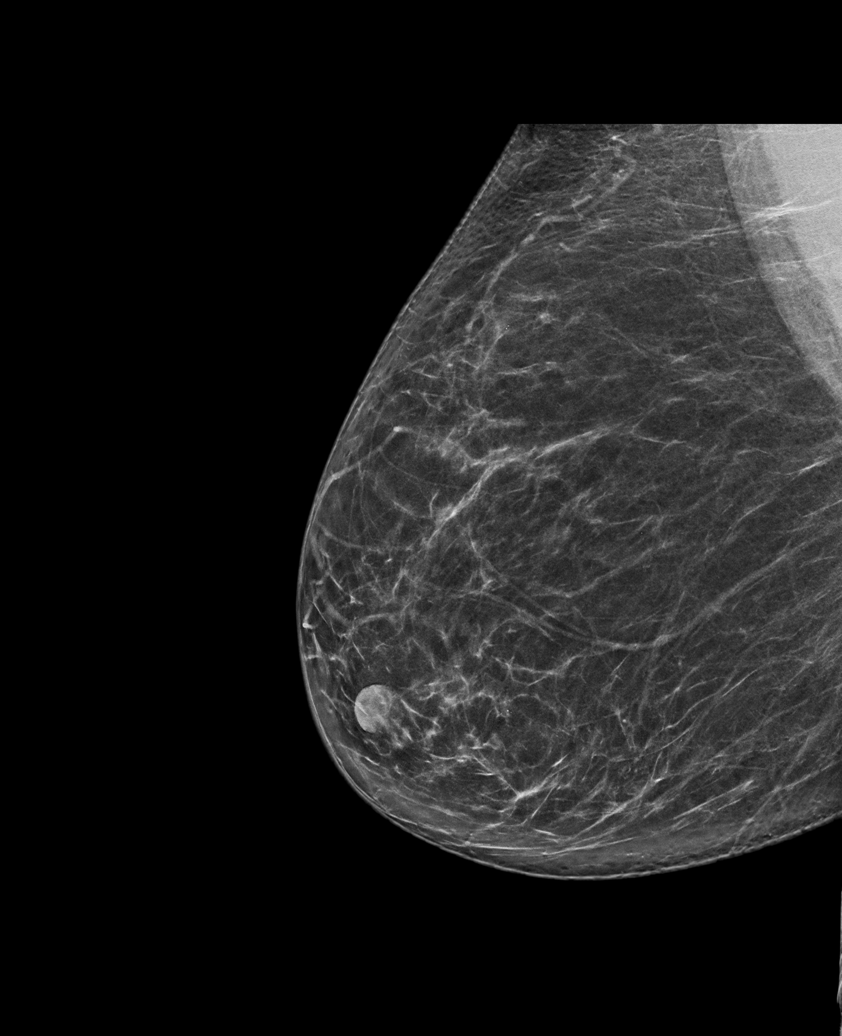

[L MLO synth-2D]
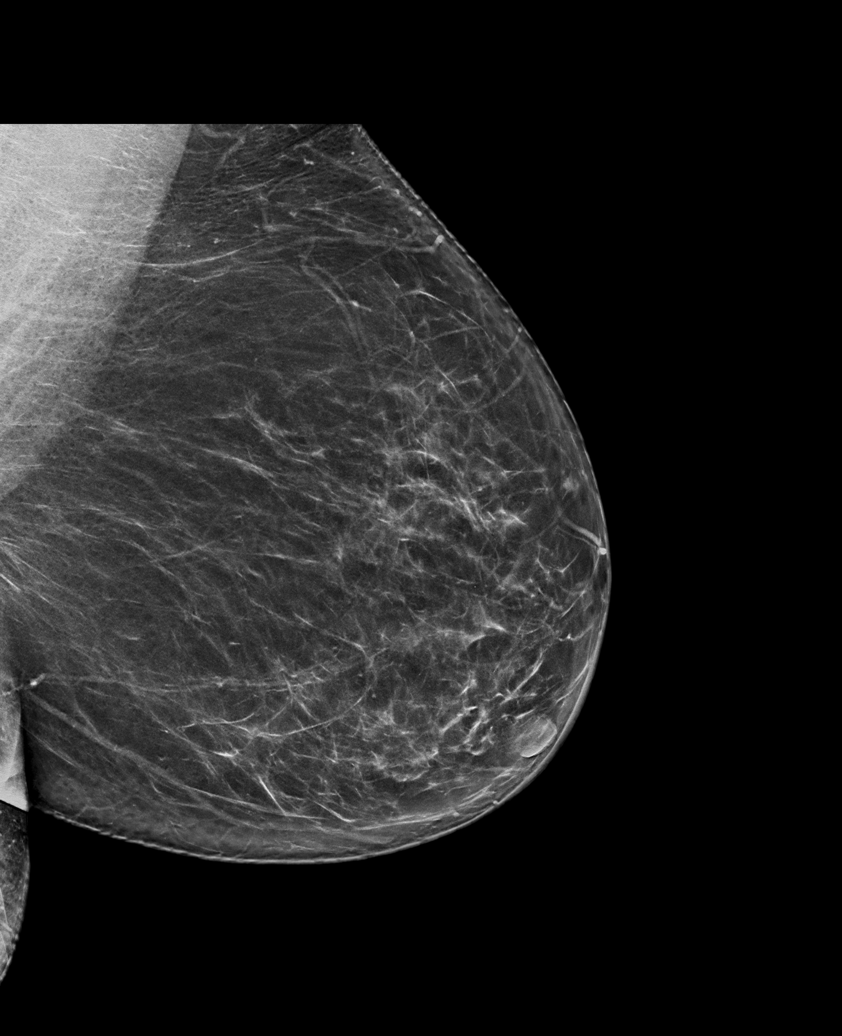

[R CC synth-2D]
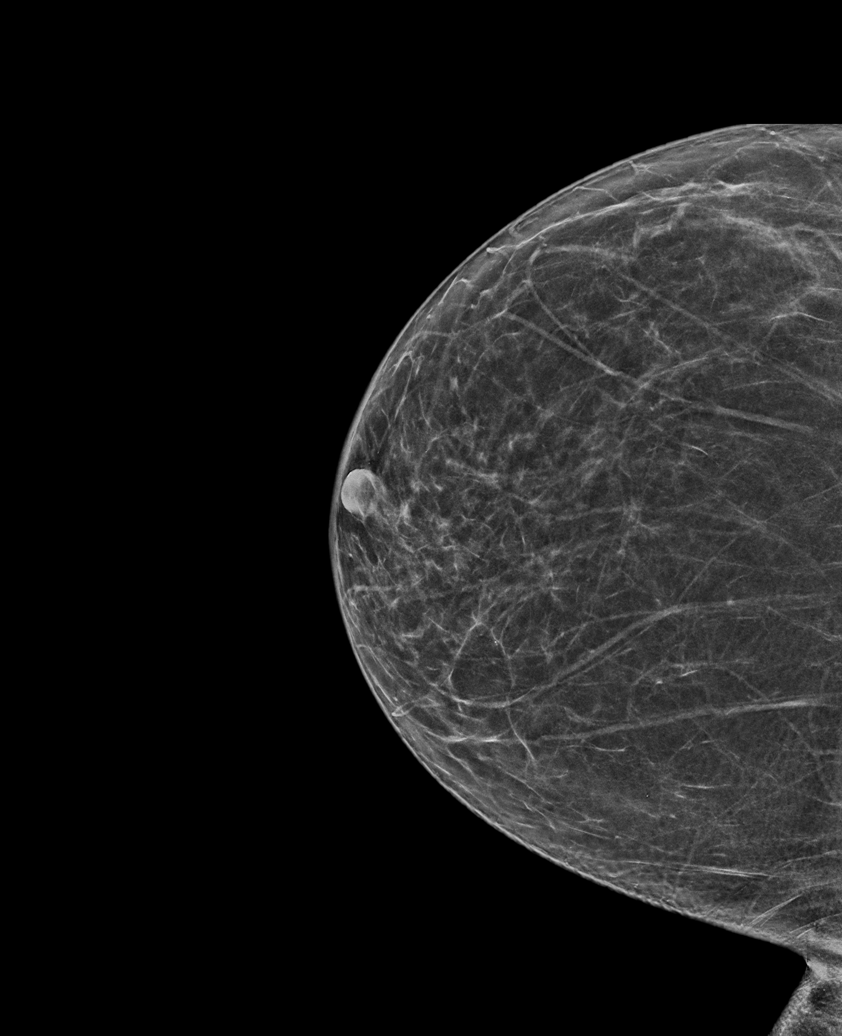

[R MLO synth-2D (2 of 2)]
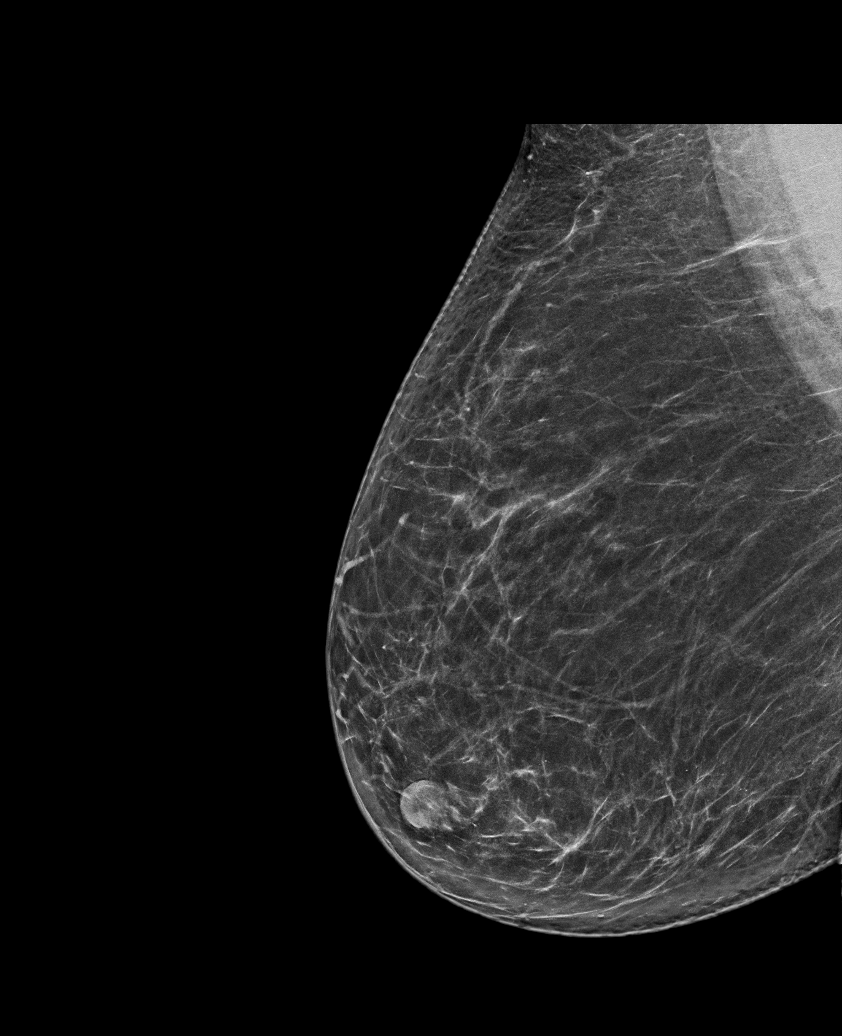

[L CC synth-2D]
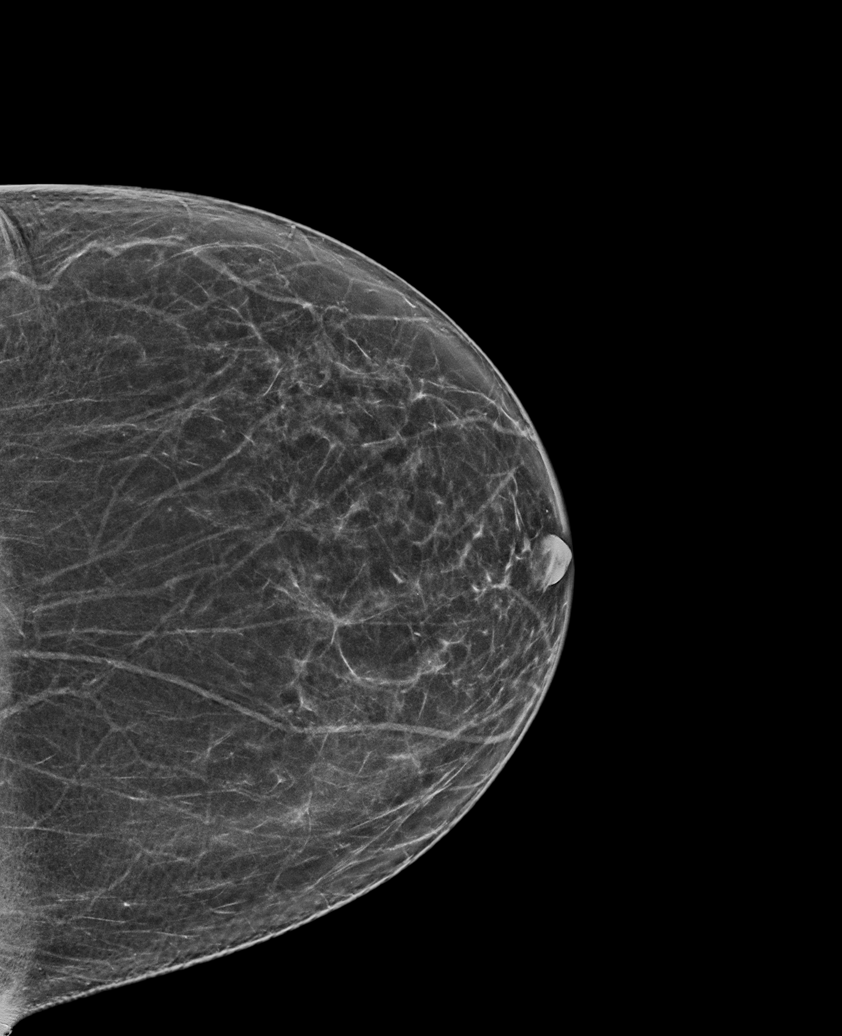

[R MLO tomo · tomo slice 41/82.0]
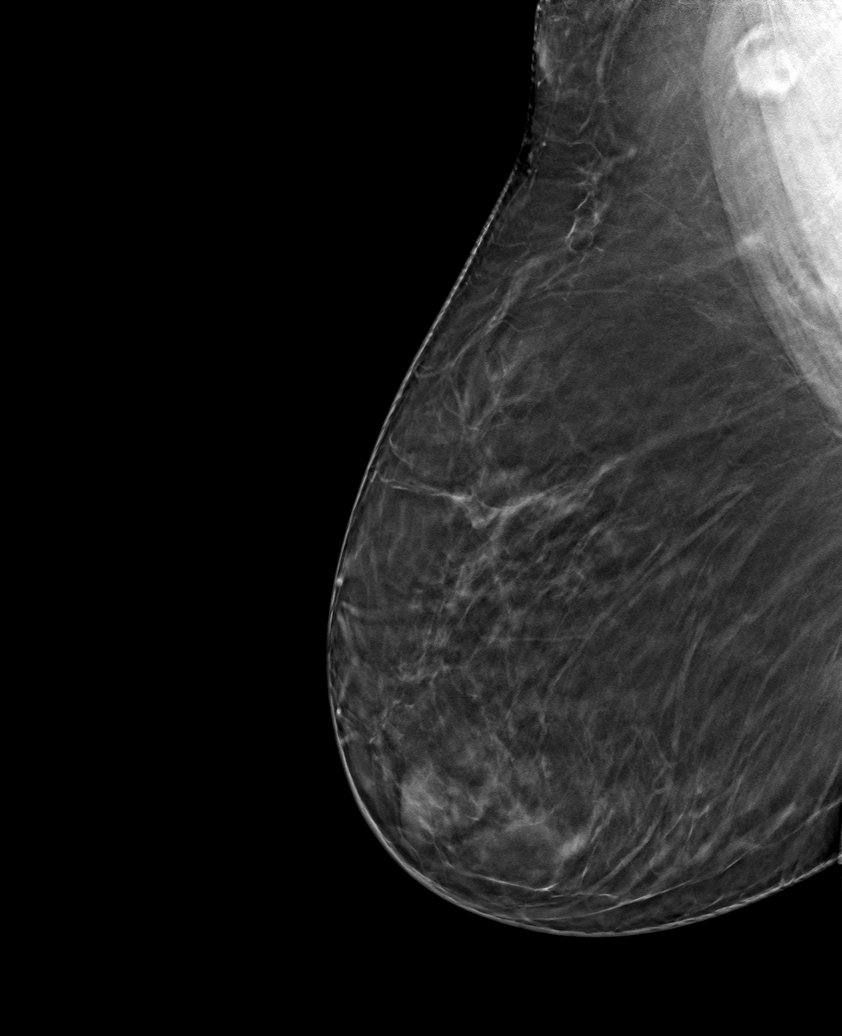

[6 of 30 positions shown; findings below may reference images not displayed]

ACR Breast Density Category b: There are scattered areas of
fibroglandular density.
FINDINGS: There are no findings suspicious for malignancy. The previously
demonstrated small left breast cyst is no longer seen. Images were
processed with CAD.
IMPRESSION: No mammographic evidence of malignancy. A result letter of this
screening mammogram will be mailed directly to the patient.

RECOMMENDATION:
Screening mammogram in one year. (Code:DQ-Z-S15)

BI-RADS CATEGORY  1: Negative.

## 2020-02-27 ENCOUNTER — Other Ambulatory Visit: Payer: Self-pay | Admitting: Internal Medicine

## 2020-02-27 DIAGNOSIS — Z1231 Encounter for screening mammogram for malignant neoplasm of breast: Secondary | ICD-10-CM

## 2020-03-02 ENCOUNTER — Ambulatory Visit: Payer: Federal, State, Local not specified - PPO | Attending: Internal Medicine

## 2020-03-02 DIAGNOSIS — Z23 Encounter for immunization: Secondary | ICD-10-CM

## 2020-03-02 NOTE — Progress Notes (Signed)
   Covid-19 Vaccination Clinic  Name:  [name]    MRN: [id number] DOB: [birth year]  03/02/2020  [name] was observed post Covid-19 immunization for 15 minutes without incident. She was provided with Vaccine Information Sheet and instruction to access the V-Safe system.   [name] was instructed to call 911 with any severe reactions post vaccine: Marland Kitchen Difficulty breathing  . Swelling of face and throat  . A fast heartbeat  . A bad rash all over body  . Dizziness and weakness   Immunizations Administered    Name Date Dose VIS Date Route   Pfizer COVID-19 Vaccine 03/02/2020 12:10 PM 0.3 mL 01/03/2020 Intramuscular   Manufacturer: [place]   Lot: [id number]   [place]: [id number]

## 2020-03-25 ENCOUNTER — Other Ambulatory Visit: Payer: Self-pay

## 2020-03-25 ENCOUNTER — Other Ambulatory Visit: Payer: Federal, State, Local not specified - PPO

## 2020-03-25 DIAGNOSIS — Z20822 Contact with and (suspected) exposure to covid-19: Secondary | ICD-10-CM | POA: Diagnosis not present

## 2020-03-27 ENCOUNTER — Other Ambulatory Visit: Payer: Federal, State, Local not specified - PPO

## 2020-03-28 LAB — NOVEL CORONAVIRUS, NAA: SARS-CoV-2, NAA: NOT DETECTED

## 2020-04-15 ENCOUNTER — Ambulatory Visit: Payer: Federal, State, Local not specified - PPO

## 2020-07-03 DIAGNOSIS — I10 Essential (primary) hypertension: Secondary | ICD-10-CM | POA: Diagnosis not present

## 2020-07-03 DIAGNOSIS — E1169 Type 2 diabetes mellitus with other specified complication: Secondary | ICD-10-CM | POA: Diagnosis not present

## 2020-07-03 DIAGNOSIS — Z6835 Body mass index (BMI) 35.0-35.9, adult: Secondary | ICD-10-CM | POA: Diagnosis not present

## 2020-07-24 ENCOUNTER — Ambulatory Visit
Admission: RE | Admit: 2020-07-24 | Discharge: 2020-07-24 | Disposition: A | Discharge status: Home / Self Care | Payer: Managed Care, Other (non HMO) | Source: Ambulatory Visit | Attending: Internal Medicine | Admitting: Internal Medicine

## 2020-07-24 ENCOUNTER — Other Ambulatory Visit: Payer: Self-pay

## 2020-07-24 DIAGNOSIS — Z1231 Encounter for screening mammogram for malignant neoplasm of breast: Secondary | ICD-10-CM

## 2021-02-04 DIAGNOSIS — Z20822 Contact with and (suspected) exposure to covid-19: Secondary | ICD-10-CM | POA: Diagnosis not present

## 2021-02-15 DIAGNOSIS — Z20822 Contact with and (suspected) exposure to covid-19: Secondary | ICD-10-CM | POA: Diagnosis not present

## 2021-07-04 DIAGNOSIS — Z6836 Body mass index (BMI) 36.0-36.9, adult: Secondary | ICD-10-CM | POA: Diagnosis not present

## 2021-07-04 DIAGNOSIS — I10 Essential (primary) hypertension: Secondary | ICD-10-CM | POA: Diagnosis not present

## 2021-07-04 DIAGNOSIS — E1169 Type 2 diabetes mellitus with other specified complication: Secondary | ICD-10-CM | POA: Diagnosis not present

## 2021-07-04 DIAGNOSIS — Z23 Encounter for immunization: Secondary | ICD-10-CM | POA: Diagnosis not present

## 2021-10-29 DIAGNOSIS — Z1331 Encounter for screening for depression: Secondary | ICD-10-CM | POA: Diagnosis not present

## 2021-10-29 DIAGNOSIS — Z9189 Other specified personal risk factors, not elsewhere classified: Secondary | ICD-10-CM | POA: Diagnosis not present

## 2021-10-29 DIAGNOSIS — E559 Vitamin D deficiency, unspecified: Secondary | ICD-10-CM | POA: Diagnosis not present

## 2021-10-29 DIAGNOSIS — E8889 Other specified metabolic disorders: Secondary | ICD-10-CM | POA: Diagnosis not present

## 2021-10-29 DIAGNOSIS — I10 Essential (primary) hypertension: Secondary | ICD-10-CM | POA: Diagnosis not present

## 2021-10-29 DIAGNOSIS — E118 Type 2 diabetes mellitus with unspecified complications: Secondary | ICD-10-CM | POA: Diagnosis not present

## 2021-11-19 DIAGNOSIS — E118 Type 2 diabetes mellitus with unspecified complications: Secondary | ICD-10-CM | POA: Diagnosis not present

## 2021-11-19 DIAGNOSIS — E559 Vitamin D deficiency, unspecified: Secondary | ICD-10-CM | POA: Diagnosis not present

## 2021-11-19 DIAGNOSIS — Z9189 Other specified personal risk factors, not elsewhere classified: Secondary | ICD-10-CM | POA: Diagnosis not present

## 2021-11-19 DIAGNOSIS — I10 Essential (primary) hypertension: Secondary | ICD-10-CM | POA: Diagnosis not present

## 2021-11-24 ENCOUNTER — Other Ambulatory Visit: Payer: Self-pay | Admitting: Internal Medicine

## 2021-11-24 DIAGNOSIS — Z1231 Encounter for screening mammogram for malignant neoplasm of breast: Secondary | ICD-10-CM

## 2021-12-10 DIAGNOSIS — K5900 Constipation, unspecified: Secondary | ICD-10-CM | POA: Diagnosis not present

## 2021-12-10 DIAGNOSIS — E118 Type 2 diabetes mellitus with unspecified complications: Secondary | ICD-10-CM | POA: Diagnosis not present

## 2021-12-23 DIAGNOSIS — M65312 Trigger thumb, left thumb: Secondary | ICD-10-CM | POA: Diagnosis not present

## 2021-12-24 ENCOUNTER — Ambulatory Visit: Payer: Managed Care, Other (non HMO)

## 2022-01-07 DIAGNOSIS — E669 Obesity, unspecified: Secondary | ICD-10-CM | POA: Diagnosis not present

## 2022-01-07 DIAGNOSIS — K5900 Constipation, unspecified: Secondary | ICD-10-CM | POA: Diagnosis not present

## 2022-01-07 DIAGNOSIS — E118 Type 2 diabetes mellitus with unspecified complications: Secondary | ICD-10-CM | POA: Diagnosis not present

## 2022-01-07 DIAGNOSIS — Z9189 Other specified personal risk factors, not elsewhere classified: Secondary | ICD-10-CM | POA: Diagnosis not present

## 2022-01-28 ENCOUNTER — Ambulatory Visit
Admission: RE | Admit: 2022-01-28 | Discharge: 2022-01-28 | Disposition: A | Discharge status: Home / Self Care | Payer: Managed Care, Other (non HMO) | Source: Ambulatory Visit | Attending: Internal Medicine | Admitting: Internal Medicine

## 2022-01-28 DIAGNOSIS — Z1231 Encounter for screening mammogram for malignant neoplasm of breast: Secondary | ICD-10-CM

## 2022-02-04 ENCOUNTER — Ambulatory Visit: Payer: Managed Care, Other (non HMO)

## 2022-03-04 DIAGNOSIS — E669 Obesity, unspecified: Secondary | ICD-10-CM | POA: Diagnosis not present

## 2022-03-04 DIAGNOSIS — E118 Type 2 diabetes mellitus with unspecified complications: Secondary | ICD-10-CM | POA: Diagnosis not present

## 2022-03-04 DIAGNOSIS — K5900 Constipation, unspecified: Secondary | ICD-10-CM | POA: Diagnosis not present

## 2022-03-04 DIAGNOSIS — Z6834 Body mass index (BMI) 34.0-34.9, adult: Secondary | ICD-10-CM | POA: Diagnosis not present

## 2022-04-29 DIAGNOSIS — E669 Obesity, unspecified: Secondary | ICD-10-CM | POA: Diagnosis not present

## 2022-04-29 DIAGNOSIS — Z6833 Body mass index (BMI) 33.0-33.9, adult: Secondary | ICD-10-CM | POA: Diagnosis not present

## 2022-04-29 DIAGNOSIS — E118 Type 2 diabetes mellitus with unspecified complications: Secondary | ICD-10-CM | POA: Diagnosis not present

## 2022-05-07 DIAGNOSIS — M65312 Trigger thumb, left thumb: Secondary | ICD-10-CM | POA: Diagnosis not present

## 2022-07-21 DIAGNOSIS — E1169 Type 2 diabetes mellitus with other specified complication: Secondary | ICD-10-CM | POA: Diagnosis not present

## 2022-07-21 DIAGNOSIS — E559 Vitamin D deficiency, unspecified: Secondary | ICD-10-CM | POA: Diagnosis not present

## 2022-07-21 DIAGNOSIS — I10 Essential (primary) hypertension: Secondary | ICD-10-CM | POA: Diagnosis not present

## 2022-07-21 DIAGNOSIS — E785 Hyperlipidemia, unspecified: Secondary | ICD-10-CM | POA: Diagnosis not present

## 2022-08-05 DIAGNOSIS — I152 Hypertension secondary to endocrine disorders: Secondary | ICD-10-CM | POA: Diagnosis not present

## 2022-08-05 DIAGNOSIS — E669 Obesity, unspecified: Secondary | ICD-10-CM | POA: Diagnosis not present

## 2022-08-05 DIAGNOSIS — E1169 Type 2 diabetes mellitus with other specified complication: Secondary | ICD-10-CM | POA: Diagnosis not present

## 2022-08-05 DIAGNOSIS — E785 Hyperlipidemia, unspecified: Secondary | ICD-10-CM | POA: Diagnosis not present

## 2022-09-23 DIAGNOSIS — E669 Obesity, unspecified: Secondary | ICD-10-CM | POA: Diagnosis not present

## 2022-09-23 DIAGNOSIS — E1169 Type 2 diabetes mellitus with other specified complication: Secondary | ICD-10-CM | POA: Diagnosis not present

## 2022-09-23 DIAGNOSIS — R632 Polyphagia: Secondary | ICD-10-CM | POA: Diagnosis not present

## 2022-09-23 DIAGNOSIS — R638 Other symptoms and signs concerning food and fluid intake: Secondary | ICD-10-CM | POA: Diagnosis not present

## 2022-11-11 DIAGNOSIS — E1169 Type 2 diabetes mellitus with other specified complication: Secondary | ICD-10-CM | POA: Diagnosis not present

## 2022-11-11 DIAGNOSIS — Z9189 Other specified personal risk factors, not elsewhere classified: Secondary | ICD-10-CM | POA: Diagnosis not present

## 2022-11-11 DIAGNOSIS — I152 Hypertension secondary to endocrine disorders: Secondary | ICD-10-CM | POA: Diagnosis not present

## 2022-11-11 DIAGNOSIS — E559 Vitamin D deficiency, unspecified: Secondary | ICD-10-CM | POA: Diagnosis not present

## 2022-11-11 DIAGNOSIS — E785 Hyperlipidemia, unspecified: Secondary | ICD-10-CM | POA: Diagnosis not present

## 2022-12-30 DIAGNOSIS — M19071 Primary osteoarthritis, right ankle and foot: Secondary | ICD-10-CM | POA: Diagnosis not present

## 2023-01-06 DIAGNOSIS — M79671 Pain in right foot: Secondary | ICD-10-CM | POA: Diagnosis not present

## 2023-01-27 DIAGNOSIS — M79671 Pain in right foot: Secondary | ICD-10-CM | POA: Diagnosis not present

## 2023-02-03 ENCOUNTER — Other Ambulatory Visit: Payer: Self-pay | Admitting: Internal Medicine

## 2023-02-03 DIAGNOSIS — I10 Essential (primary) hypertension: Secondary | ICD-10-CM | POA: Diagnosis not present

## 2023-02-03 DIAGNOSIS — Z0000 Encounter for general adult medical examination without abnormal findings: Secondary | ICD-10-CM | POA: Diagnosis not present

## 2023-02-03 DIAGNOSIS — E559 Vitamin D deficiency, unspecified: Secondary | ICD-10-CM | POA: Diagnosis not present

## 2023-02-03 DIAGNOSIS — Z1231 Encounter for screening mammogram for malignant neoplasm of breast: Secondary | ICD-10-CM

## 2023-02-03 DIAGNOSIS — E785 Hyperlipidemia, unspecified: Secondary | ICD-10-CM | POA: Diagnosis not present

## 2023-02-03 DIAGNOSIS — Z79899 Other long term (current) drug therapy: Secondary | ICD-10-CM | POA: Diagnosis not present

## 2023-02-03 DIAGNOSIS — Z23 Encounter for immunization: Secondary | ICD-10-CM | POA: Diagnosis not present

## 2023-02-03 DIAGNOSIS — E1169 Type 2 diabetes mellitus with other specified complication: Secondary | ICD-10-CM | POA: Diagnosis not present

## 2023-03-03 ENCOUNTER — Ambulatory Visit
Admission: RE | Admit: 2023-03-03 | Discharge: 2023-03-03 | Disposition: A | Discharge status: Home / Self Care | Payer: Managed Care, Other (non HMO) | Source: Ambulatory Visit

## 2023-03-03 DIAGNOSIS — Z1231 Encounter for screening mammogram for malignant neoplasm of breast: Secondary | ICD-10-CM

## 2023-03-18 DIAGNOSIS — R051 Acute cough: Secondary | ICD-10-CM | POA: Diagnosis not present

## 2023-04-13 DIAGNOSIS — I10 Essential (primary) hypertension: Secondary | ICD-10-CM | POA: Diagnosis not present

## 2023-04-13 DIAGNOSIS — E1169 Type 2 diabetes mellitus with other specified complication: Secondary | ICD-10-CM | POA: Diagnosis not present

## 2023-04-13 DIAGNOSIS — Z6834 Body mass index (BMI) 34.0-34.9, adult: Secondary | ICD-10-CM | POA: Diagnosis not present

## 2023-04-13 DIAGNOSIS — E785 Hyperlipidemia, unspecified: Secondary | ICD-10-CM | POA: Diagnosis not present

## 2023-04-13 DIAGNOSIS — E559 Vitamin D deficiency, unspecified: Secondary | ICD-10-CM | POA: Diagnosis not present

## 2023-08-04 DIAGNOSIS — I10 Essential (primary) hypertension: Secondary | ICD-10-CM | POA: Diagnosis not present

## 2023-08-04 DIAGNOSIS — Z6836 Body mass index (BMI) 36.0-36.9, adult: Secondary | ICD-10-CM | POA: Diagnosis not present

## 2023-08-04 DIAGNOSIS — E6609 Other obesity due to excess calories: Secondary | ICD-10-CM | POA: Diagnosis not present

## 2023-08-04 DIAGNOSIS — E785 Hyperlipidemia, unspecified: Secondary | ICD-10-CM | POA: Diagnosis not present

## 2023-08-04 DIAGNOSIS — E1169 Type 2 diabetes mellitus with other specified complication: Secondary | ICD-10-CM | POA: Diagnosis not present

## 2023-09-16 DIAGNOSIS — R632 Polyphagia: Secondary | ICD-10-CM | POA: Diagnosis not present

## 2023-09-16 DIAGNOSIS — E1169 Type 2 diabetes mellitus with other specified complication: Secondary | ICD-10-CM | POA: Diagnosis not present

## 2023-09-16 DIAGNOSIS — E785 Hyperlipidemia, unspecified: Secondary | ICD-10-CM | POA: Diagnosis not present

## 2023-09-16 DIAGNOSIS — I10 Essential (primary) hypertension: Secondary | ICD-10-CM | POA: Diagnosis not present

## 2024-01-06 DIAGNOSIS — R632 Polyphagia: Secondary | ICD-10-CM | POA: Diagnosis not present

## 2024-01-06 DIAGNOSIS — I10 Essential (primary) hypertension: Secondary | ICD-10-CM | POA: Diagnosis not present

## 2024-01-06 DIAGNOSIS — E785 Hyperlipidemia, unspecified: Secondary | ICD-10-CM | POA: Diagnosis not present

## 2024-01-06 DIAGNOSIS — E1169 Type 2 diabetes mellitus with other specified complication: Secondary | ICD-10-CM | POA: Diagnosis not present

## 2024-02-16 DIAGNOSIS — E785 Hyperlipidemia, unspecified: Secondary | ICD-10-CM | POA: Diagnosis not present

## 2024-02-16 DIAGNOSIS — Z0000 Encounter for general adult medical examination without abnormal findings: Secondary | ICD-10-CM | POA: Diagnosis not present

## 2024-02-16 DIAGNOSIS — E1169 Type 2 diabetes mellitus with other specified complication: Secondary | ICD-10-CM | POA: Diagnosis not present

## 2024-02-16 DIAGNOSIS — E559 Vitamin D deficiency, unspecified: Secondary | ICD-10-CM | POA: Diagnosis not present

## 2024-02-16 DIAGNOSIS — E1159 Type 2 diabetes mellitus with other circulatory complications: Secondary | ICD-10-CM | POA: Diagnosis not present

## 2024-02-16 DIAGNOSIS — Z79899 Other long term (current) drug therapy: Secondary | ICD-10-CM | POA: Diagnosis not present

## 2024-03-27 ENCOUNTER — Other Ambulatory Visit: Payer: Self-pay | Admitting: Internal Medicine

## 2024-03-27 DIAGNOSIS — Z1231 Encounter for screening mammogram for malignant neoplasm of breast: Secondary | ICD-10-CM

## 2024-04-12 ENCOUNTER — Ambulatory Visit
